# Patient Record
Sex: Female | Born: 1935 | Race: White | Hispanic: No | Marital: Married | Smoking: Never smoker
Health system: Southern US, Community
[De-identification: ages and names within clinical notes are randomized; demographics above are authoritative.]

## PROBLEM LIST (undated history)

## (undated) DIAGNOSIS — K219 Gastro-esophageal reflux disease without esophagitis: Secondary | ICD-10-CM

## (undated) DIAGNOSIS — Z7901 Long term (current) use of anticoagulants: Secondary | ICD-10-CM

## (undated) DIAGNOSIS — E876 Hypokalemia: Secondary | ICD-10-CM

## (undated) DIAGNOSIS — R7301 Impaired fasting glucose: Secondary | ICD-10-CM

## (undated) DIAGNOSIS — F419 Anxiety disorder, unspecified: Secondary | ICD-10-CM

## (undated) DIAGNOSIS — I4819 Other persistent atrial fibrillation: Secondary | ICD-10-CM

## (undated) DIAGNOSIS — E785 Hyperlipidemia, unspecified: Secondary | ICD-10-CM

## (undated) DIAGNOSIS — I10 Essential (primary) hypertension: Secondary | ICD-10-CM

## (undated) DIAGNOSIS — I4891 Unspecified atrial fibrillation: Secondary | ICD-10-CM

## (undated) HISTORY — PX: CATARACT EXTRACTION: SUR2

## (undated) HISTORY — PX: DILATION AND CURETTAGE OF UTERUS: SHX78

## (undated) HISTORY — DX: Hypokalemia: E87.6

## (undated) HISTORY — DX: Unspecified atrial fibrillation: I48.91

## (undated) HISTORY — DX: Other persistent atrial fibrillation: I48.19

## (undated) HISTORY — PX: BREAST EXCISIONAL BIOPSY: SUR124

## (undated) HISTORY — DX: Gastro-esophageal reflux disease without esophagitis: K21.9

## (undated) HISTORY — DX: Long term (current) use of anticoagulants: Z79.01

## (undated) HISTORY — DX: Impaired fasting glucose: R73.01

## (undated) HISTORY — DX: Hyperlipidemia, unspecified: E78.5

## (undated) HISTORY — DX: Anxiety disorder, unspecified: F41.9

## (undated) HISTORY — DX: Essential (primary) hypertension: I10

---

## 2000-01-11 ENCOUNTER — Encounter: Payer: Self-pay | Admitting: Family Medicine

## 2000-01-11 ENCOUNTER — Encounter: Admission: RE | Admit: 2000-01-11 | Discharge: 2000-01-11 | Payer: Self-pay | Admitting: Family Medicine

## 2001-01-13 ENCOUNTER — Encounter: Payer: Self-pay | Admitting: Family Medicine

## 2001-01-13 ENCOUNTER — Encounter: Admission: RE | Admit: 2001-01-13 | Discharge: 2001-01-13 | Payer: Self-pay | Admitting: Family Medicine

## 2002-01-29 ENCOUNTER — Encounter: Payer: Self-pay | Admitting: Family Medicine

## 2002-01-29 ENCOUNTER — Encounter: Admission: RE | Admit: 2002-01-29 | Discharge: 2002-01-29 | Payer: Self-pay | Admitting: Family Medicine

## 2003-03-08 ENCOUNTER — Encounter: Admission: RE | Admit: 2003-03-08 | Discharge: 2003-03-08 | Payer: Self-pay | Admitting: Family Medicine

## 2003-03-08 ENCOUNTER — Encounter: Payer: Self-pay | Admitting: Family Medicine

## 2004-03-11 ENCOUNTER — Encounter: Admission: RE | Admit: 2004-03-11 | Discharge: 2004-03-11 | Payer: Self-pay | Admitting: Family Medicine

## 2005-04-29 ENCOUNTER — Encounter: Admission: RE | Admit: 2005-04-29 | Discharge: 2005-04-29 | Payer: Self-pay | Admitting: Family Medicine

## 2006-05-24 ENCOUNTER — Encounter: Admission: RE | Admit: 2006-05-24 | Discharge: 2006-05-24 | Payer: Self-pay | Admitting: Family Medicine

## 2007-06-16 ENCOUNTER — Encounter: Admission: RE | Admit: 2007-06-16 | Discharge: 2007-06-16 | Payer: Self-pay | Admitting: Family Medicine

## 2008-06-17 ENCOUNTER — Encounter: Admission: RE | Admit: 2008-06-17 | Discharge: 2008-06-17 | Payer: Self-pay | Admitting: Family Medicine

## 2009-08-05 ENCOUNTER — Encounter: Admission: RE | Admit: 2009-08-05 | Discharge: 2009-08-05 | Payer: Self-pay | Admitting: Family Medicine

## 2010-09-08 ENCOUNTER — Encounter
Admission: RE | Admit: 2010-09-08 | Discharge: 2010-09-08 | Payer: Self-pay | Source: Home / Self Care | Attending: Family Medicine | Admitting: Family Medicine

## 2011-11-25 ENCOUNTER — Other Ambulatory Visit: Payer: Self-pay | Admitting: Family Medicine

## 2011-11-25 DIAGNOSIS — Z1231 Encounter for screening mammogram for malignant neoplasm of breast: Secondary | ICD-10-CM

## 2011-12-07 ENCOUNTER — Ambulatory Visit
Admission: RE | Admit: 2011-12-07 | Discharge: 2011-12-07 | Disposition: A | Discharge status: Home / Self Care | Payer: Medicare Other | Source: Ambulatory Visit | Attending: Family Medicine | Admitting: Family Medicine

## 2011-12-07 DIAGNOSIS — Z1231 Encounter for screening mammogram for malignant neoplasm of breast: Secondary | ICD-10-CM

## 2012-04-13 DIAGNOSIS — E7800 Pure hypercholesterolemia, unspecified: Secondary | ICD-10-CM | POA: Diagnosis not present

## 2012-04-13 DIAGNOSIS — Z79899 Other long term (current) drug therapy: Secondary | ICD-10-CM | POA: Diagnosis not present

## 2012-04-13 DIAGNOSIS — I10 Essential (primary) hypertension: Secondary | ICD-10-CM | POA: Diagnosis not present

## 2012-04-13 DIAGNOSIS — F411 Generalized anxiety disorder: Secondary | ICD-10-CM | POA: Diagnosis not present

## 2012-04-13 DIAGNOSIS — K219 Gastro-esophageal reflux disease without esophagitis: Secondary | ICD-10-CM | POA: Diagnosis not present

## 2012-04-13 DIAGNOSIS — R7301 Impaired fasting glucose: Secondary | ICD-10-CM | POA: Diagnosis not present

## 2012-07-07 DIAGNOSIS — H26499 Other secondary cataract, unspecified eye: Secondary | ICD-10-CM | POA: Diagnosis not present

## 2012-07-07 DIAGNOSIS — H2589 Other age-related cataract: Secondary | ICD-10-CM | POA: Diagnosis not present

## 2012-07-07 DIAGNOSIS — H43819 Vitreous degeneration, unspecified eye: Secondary | ICD-10-CM | POA: Diagnosis not present

## 2012-07-25 DIAGNOSIS — H35379 Puckering of macula, unspecified eye: Secondary | ICD-10-CM | POA: Diagnosis not present

## 2012-08-14 DIAGNOSIS — H35379 Puckering of macula, unspecified eye: Secondary | ICD-10-CM | POA: Diagnosis not present

## 2012-08-14 DIAGNOSIS — Z961 Presence of intraocular lens: Secondary | ICD-10-CM | POA: Diagnosis not present

## 2012-08-14 DIAGNOSIS — H35349 Macular cyst, hole, or pseudohole, unspecified eye: Secondary | ICD-10-CM | POA: Diagnosis not present

## 2012-08-31 DIAGNOSIS — Z23 Encounter for immunization: Secondary | ICD-10-CM | POA: Diagnosis not present

## 2012-10-10 DIAGNOSIS — H35349 Macular cyst, hole, or pseudohole, unspecified eye: Secondary | ICD-10-CM | POA: Diagnosis not present

## 2013-01-01 DIAGNOSIS — H2589 Other age-related cataract: Secondary | ICD-10-CM | POA: Diagnosis not present

## 2013-01-08 ENCOUNTER — Other Ambulatory Visit: Payer: Self-pay

## 2013-01-08 DIAGNOSIS — Z1231 Encounter for screening mammogram for malignant neoplasm of breast: Secondary | ICD-10-CM

## 2013-01-09 DIAGNOSIS — H35349 Macular cyst, hole, or pseudohole, unspecified eye: Secondary | ICD-10-CM | POA: Diagnosis not present

## 2013-01-09 DIAGNOSIS — H35379 Puckering of macula, unspecified eye: Secondary | ICD-10-CM | POA: Diagnosis not present

## 2013-01-30 ENCOUNTER — Ambulatory Visit
Admission: RE | Admit: 2013-01-30 | Discharge: 2013-01-30 | Disposition: A | Discharge status: Home / Self Care | Payer: Medicare Other | Source: Ambulatory Visit

## 2013-01-30 DIAGNOSIS — Z1231 Encounter for screening mammogram for malignant neoplasm of breast: Secondary | ICD-10-CM

## 2013-07-05 DIAGNOSIS — I10 Essential (primary) hypertension: Secondary | ICD-10-CM | POA: Diagnosis not present

## 2013-07-05 DIAGNOSIS — Z79899 Other long term (current) drug therapy: Secondary | ICD-10-CM | POA: Diagnosis not present

## 2013-07-05 DIAGNOSIS — Z23 Encounter for immunization: Secondary | ICD-10-CM | POA: Diagnosis not present

## 2013-07-05 DIAGNOSIS — E669 Obesity, unspecified: Secondary | ICD-10-CM | POA: Diagnosis not present

## 2013-07-05 DIAGNOSIS — Z0000 Encounter for general adult medical examination without abnormal findings: Secondary | ICD-10-CM | POA: Diagnosis not present

## 2013-07-05 DIAGNOSIS — K219 Gastro-esophageal reflux disease without esophagitis: Secondary | ICD-10-CM | POA: Diagnosis not present

## 2013-07-05 DIAGNOSIS — E7800 Pure hypercholesterolemia, unspecified: Secondary | ICD-10-CM | POA: Diagnosis not present

## 2013-07-16 DIAGNOSIS — J209 Acute bronchitis, unspecified: Secondary | ICD-10-CM | POA: Diagnosis not present

## 2013-07-16 DIAGNOSIS — I10 Essential (primary) hypertension: Secondary | ICD-10-CM | POA: Diagnosis not present

## 2013-07-16 DIAGNOSIS — R7309 Other abnormal glucose: Secondary | ICD-10-CM | POA: Diagnosis not present

## 2013-07-24 DIAGNOSIS — Z006 Encounter for examination for normal comparison and control in clinical research program: Secondary | ICD-10-CM | POA: Diagnosis not present

## 2013-07-24 DIAGNOSIS — I10 Essential (primary) hypertension: Secondary | ICD-10-CM | POA: Diagnosis not present

## 2013-07-24 DIAGNOSIS — E7800 Pure hypercholesterolemia, unspecified: Secondary | ICD-10-CM | POA: Diagnosis not present

## 2013-07-24 DIAGNOSIS — K219 Gastro-esophageal reflux disease without esophagitis: Secondary | ICD-10-CM | POA: Diagnosis not present

## 2013-08-13 DIAGNOSIS — T783XXA Angioneurotic edema, initial encounter: Secondary | ICD-10-CM | POA: Diagnosis not present

## 2014-02-15 ENCOUNTER — Other Ambulatory Visit: Payer: Self-pay

## 2014-02-15 DIAGNOSIS — Z1231 Encounter for screening mammogram for malignant neoplasm of breast: Secondary | ICD-10-CM

## 2014-03-08 ENCOUNTER — Ambulatory Visit
Admission: RE | Admit: 2014-03-08 | Discharge: 2014-03-08 | Disposition: A | Discharge status: Home / Self Care | Payer: Medicare Other | Source: Ambulatory Visit

## 2014-03-08 DIAGNOSIS — Z1231 Encounter for screening mammogram for malignant neoplasm of breast: Secondary | ICD-10-CM | POA: Diagnosis not present

## 2014-04-23 DIAGNOSIS — H2589 Other age-related cataract: Secondary | ICD-10-CM | POA: Diagnosis not present

## 2014-07-03 DIAGNOSIS — Z23 Encounter for immunization: Secondary | ICD-10-CM | POA: Diagnosis not present

## 2014-08-14 DIAGNOSIS — Z79899 Other long term (current) drug therapy: Secondary | ICD-10-CM | POA: Diagnosis not present

## 2014-08-14 DIAGNOSIS — E780 Pure hypercholesterolemia: Secondary | ICD-10-CM | POA: Diagnosis not present

## 2014-08-14 DIAGNOSIS — Z0000 Encounter for general adult medical examination without abnormal findings: Secondary | ICD-10-CM | POA: Diagnosis not present

## 2014-08-19 DIAGNOSIS — E559 Vitamin D deficiency, unspecified: Secondary | ICD-10-CM | POA: Diagnosis not present

## 2014-08-19 DIAGNOSIS — E780 Pure hypercholesterolemia: Secondary | ICD-10-CM | POA: Diagnosis not present

## 2014-08-19 DIAGNOSIS — I10 Essential (primary) hypertension: Secondary | ICD-10-CM | POA: Diagnosis not present

## 2014-08-19 DIAGNOSIS — Z23 Encounter for immunization: Secondary | ICD-10-CM | POA: Diagnosis not present

## 2014-08-19 DIAGNOSIS — R7301 Impaired fasting glucose: Secondary | ICD-10-CM | POA: Diagnosis not present

## 2014-08-19 DIAGNOSIS — Z0000 Encounter for general adult medical examination without abnormal findings: Secondary | ICD-10-CM | POA: Diagnosis not present

## 2015-02-13 DIAGNOSIS — H8113 Benign paroxysmal vertigo, bilateral: Secondary | ICD-10-CM | POA: Diagnosis not present

## 2015-05-15 ENCOUNTER — Other Ambulatory Visit: Payer: Self-pay

## 2015-05-15 DIAGNOSIS — Z1231 Encounter for screening mammogram for malignant neoplasm of breast: Secondary | ICD-10-CM

## 2015-05-27 DIAGNOSIS — J302 Other seasonal allergic rhinitis: Secondary | ICD-10-CM | POA: Diagnosis not present

## 2015-05-27 DIAGNOSIS — I10 Essential (primary) hypertension: Secondary | ICD-10-CM | POA: Diagnosis not present

## 2015-05-29 ENCOUNTER — Ambulatory Visit
Admission: RE | Admit: 2015-05-29 | Discharge: 2015-05-29 | Disposition: A | Discharge status: Home / Self Care | Payer: Medicare Other | Source: Ambulatory Visit

## 2015-05-29 DIAGNOSIS — Z1231 Encounter for screening mammogram for malignant neoplasm of breast: Secondary | ICD-10-CM

## 2015-06-30 DIAGNOSIS — E782 Mixed hyperlipidemia: Secondary | ICD-10-CM | POA: Diagnosis not present

## 2015-06-30 DIAGNOSIS — I10 Essential (primary) hypertension: Secondary | ICD-10-CM | POA: Diagnosis not present

## 2015-06-30 DIAGNOSIS — Z23 Encounter for immunization: Secondary | ICD-10-CM | POA: Diagnosis not present

## 2015-06-30 DIAGNOSIS — Z7901 Long term (current) use of anticoagulants: Secondary | ICD-10-CM | POA: Diagnosis not present

## 2015-06-30 DIAGNOSIS — I4891 Unspecified atrial fibrillation: Secondary | ICD-10-CM | POA: Diagnosis not present

## 2015-06-30 DIAGNOSIS — Z79899 Other long term (current) drug therapy: Secondary | ICD-10-CM | POA: Diagnosis not present

## 2015-07-15 DIAGNOSIS — I4819 Other persistent atrial fibrillation: Secondary | ICD-10-CM | POA: Insufficient documentation

## 2015-07-15 DIAGNOSIS — I482 Chronic atrial fibrillation, unspecified: Secondary | ICD-10-CM | POA: Insufficient documentation

## 2015-07-15 HISTORY — DX: Other persistent atrial fibrillation: I48.19

## 2015-07-15 HISTORY — DX: Chronic atrial fibrillation, unspecified: I48.20

## 2015-07-17 DIAGNOSIS — I4891 Unspecified atrial fibrillation: Secondary | ICD-10-CM | POA: Diagnosis not present

## 2015-07-17 DIAGNOSIS — I517 Cardiomegaly: Secondary | ICD-10-CM | POA: Diagnosis not present

## 2015-07-17 DIAGNOSIS — I10 Essential (primary) hypertension: Secondary | ICD-10-CM | POA: Diagnosis not present

## 2015-07-17 DIAGNOSIS — R9431 Abnormal electrocardiogram [ECG] [EKG]: Secondary | ICD-10-CM | POA: Diagnosis not present

## 2015-07-18 DIAGNOSIS — R7301 Impaired fasting glucose: Secondary | ICD-10-CM | POA: Diagnosis not present

## 2015-07-18 DIAGNOSIS — E7800 Pure hypercholesterolemia, unspecified: Secondary | ICD-10-CM | POA: Diagnosis not present

## 2015-07-18 DIAGNOSIS — Z1329 Encounter for screening for other suspected endocrine disorder: Secondary | ICD-10-CM | POA: Diagnosis not present

## 2015-07-22 DIAGNOSIS — I4891 Unspecified atrial fibrillation: Secondary | ICD-10-CM | POA: Diagnosis not present

## 2015-07-22 DIAGNOSIS — R7301 Impaired fasting glucose: Secondary | ICD-10-CM | POA: Diagnosis not present

## 2015-07-22 DIAGNOSIS — Z1389 Encounter for screening for other disorder: Secondary | ICD-10-CM | POA: Diagnosis not present

## 2015-07-22 DIAGNOSIS — I10 Essential (primary) hypertension: Secondary | ICD-10-CM | POA: Diagnosis not present

## 2015-07-28 DIAGNOSIS — I4891 Unspecified atrial fibrillation: Secondary | ICD-10-CM | POA: Diagnosis not present

## 2015-08-05 DIAGNOSIS — H25811 Combined forms of age-related cataract, right eye: Secondary | ICD-10-CM | POA: Diagnosis not present

## 2015-08-14 DIAGNOSIS — I4891 Unspecified atrial fibrillation: Secondary | ICD-10-CM | POA: Diagnosis not present

## 2015-08-18 DIAGNOSIS — I10 Essential (primary) hypertension: Secondary | ICD-10-CM | POA: Diagnosis not present

## 2015-08-18 DIAGNOSIS — I4891 Unspecified atrial fibrillation: Secondary | ICD-10-CM | POA: Diagnosis not present

## 2015-08-26 DIAGNOSIS — E7800 Pure hypercholesterolemia, unspecified: Secondary | ICD-10-CM | POA: Diagnosis not present

## 2015-08-26 DIAGNOSIS — Z0000 Encounter for general adult medical examination without abnormal findings: Secondary | ICD-10-CM | POA: Diagnosis not present

## 2015-08-26 DIAGNOSIS — I4891 Unspecified atrial fibrillation: Secondary | ICD-10-CM | POA: Diagnosis not present

## 2015-08-26 DIAGNOSIS — I10 Essential (primary) hypertension: Secondary | ICD-10-CM | POA: Diagnosis not present

## 2015-08-26 DIAGNOSIS — Z79899 Other long term (current) drug therapy: Secondary | ICD-10-CM | POA: Diagnosis not present

## 2015-09-03 DIAGNOSIS — Z7901 Long term (current) use of anticoagulants: Secondary | ICD-10-CM | POA: Diagnosis not present

## 2015-09-03 DIAGNOSIS — I10 Essential (primary) hypertension: Secondary | ICD-10-CM | POA: Diagnosis not present

## 2015-09-03 DIAGNOSIS — I481 Persistent atrial fibrillation: Secondary | ICD-10-CM | POA: Diagnosis not present

## 2015-12-23 DIAGNOSIS — E7800 Pure hypercholesterolemia, unspecified: Secondary | ICD-10-CM | POA: Diagnosis not present

## 2015-12-23 DIAGNOSIS — I10 Essential (primary) hypertension: Secondary | ICD-10-CM | POA: Diagnosis not present

## 2015-12-23 DIAGNOSIS — E876 Hypokalemia: Secondary | ICD-10-CM | POA: Diagnosis not present

## 2015-12-23 DIAGNOSIS — R739 Hyperglycemia, unspecified: Secondary | ICD-10-CM | POA: Diagnosis not present

## 2016-07-20 DIAGNOSIS — Z23 Encounter for immunization: Secondary | ICD-10-CM | POA: Diagnosis not present

## 2016-07-21 ENCOUNTER — Other Ambulatory Visit: Payer: Self-pay | Admitting: Family Medicine

## 2016-07-21 DIAGNOSIS — Z1231 Encounter for screening mammogram for malignant neoplasm of breast: Secondary | ICD-10-CM

## 2016-08-09 DIAGNOSIS — H25811 Combined forms of age-related cataract, right eye: Secondary | ICD-10-CM | POA: Diagnosis not present

## 2016-08-09 DIAGNOSIS — H35342 Macular cyst, hole, or pseudohole, left eye: Secondary | ICD-10-CM | POA: Diagnosis not present

## 2016-08-09 DIAGNOSIS — Z961 Presence of intraocular lens: Secondary | ICD-10-CM | POA: Diagnosis not present

## 2016-08-13 ENCOUNTER — Ambulatory Visit
Admission: RE | Admit: 2016-08-13 | Discharge: 2016-08-13 | Disposition: A | Discharge status: Home / Self Care | Payer: Medicare Other | Source: Ambulatory Visit | Attending: Family Medicine | Admitting: Family Medicine

## 2016-08-13 DIAGNOSIS — Z1231 Encounter for screening mammogram for malignant neoplasm of breast: Secondary | ICD-10-CM | POA: Diagnosis not present

## 2016-09-14 DIAGNOSIS — Z7901 Long term (current) use of anticoagulants: Secondary | ICD-10-CM | POA: Diagnosis not present

## 2016-09-14 DIAGNOSIS — I481 Persistent atrial fibrillation: Secondary | ICD-10-CM | POA: Diagnosis not present

## 2016-09-14 DIAGNOSIS — I10 Essential (primary) hypertension: Secondary | ICD-10-CM | POA: Diagnosis not present

## 2016-09-14 DIAGNOSIS — R600 Localized edema: Secondary | ICD-10-CM | POA: Diagnosis not present

## 2016-09-15 DIAGNOSIS — E785 Hyperlipidemia, unspecified: Secondary | ICD-10-CM | POA: Diagnosis not present

## 2016-09-15 DIAGNOSIS — Z0000 Encounter for general adult medical examination without abnormal findings: Secondary | ICD-10-CM | POA: Diagnosis not present

## 2016-09-15 DIAGNOSIS — K219 Gastro-esophageal reflux disease without esophagitis: Secondary | ICD-10-CM | POA: Diagnosis not present

## 2016-09-15 DIAGNOSIS — I10 Essential (primary) hypertension: Secondary | ICD-10-CM | POA: Diagnosis not present

## 2016-09-15 DIAGNOSIS — I4891 Unspecified atrial fibrillation: Secondary | ICD-10-CM | POA: Diagnosis not present

## 2016-09-15 DIAGNOSIS — R7301 Impaired fasting glucose: Secondary | ICD-10-CM | POA: Diagnosis not present

## 2016-09-15 DIAGNOSIS — Z79899 Other long term (current) drug therapy: Secondary | ICD-10-CM | POA: Diagnosis not present

## 2016-09-23 DIAGNOSIS — I481 Persistent atrial fibrillation: Secondary | ICD-10-CM | POA: Diagnosis not present

## 2017-07-20 ENCOUNTER — Other Ambulatory Visit: Payer: Self-pay | Admitting: Family Medicine

## 2017-07-20 DIAGNOSIS — Z139 Encounter for screening, unspecified: Secondary | ICD-10-CM

## 2017-08-15 ENCOUNTER — Ambulatory Visit
Admission: RE | Admit: 2017-08-15 | Discharge: 2017-08-15 | Disposition: A | Discharge status: Home / Self Care | Payer: Medicare Other | Source: Ambulatory Visit | Attending: Family Medicine | Admitting: Family Medicine

## 2017-08-15 DIAGNOSIS — Z139 Encounter for screening, unspecified: Secondary | ICD-10-CM

## 2017-08-15 DIAGNOSIS — Z1231 Encounter for screening mammogram for malignant neoplasm of breast: Secondary | ICD-10-CM | POA: Diagnosis not present

## 2017-09-28 ENCOUNTER — Encounter: Payer: Self-pay | Admitting: Cardiology

## 2017-09-28 DIAGNOSIS — I10 Essential (primary) hypertension: Secondary | ICD-10-CM | POA: Diagnosis not present

## 2017-09-28 DIAGNOSIS — Z79899 Other long term (current) drug therapy: Secondary | ICD-10-CM | POA: Diagnosis not present

## 2017-09-28 DIAGNOSIS — Z23 Encounter for immunization: Secondary | ICD-10-CM | POA: Diagnosis not present

## 2017-09-28 DIAGNOSIS — I4891 Unspecified atrial fibrillation: Secondary | ICD-10-CM | POA: Diagnosis not present

## 2017-09-28 DIAGNOSIS — R7301 Impaired fasting glucose: Secondary | ICD-10-CM | POA: Diagnosis not present

## 2017-09-28 DIAGNOSIS — E785 Hyperlipidemia, unspecified: Secondary | ICD-10-CM | POA: Diagnosis not present

## 2017-09-28 DIAGNOSIS — Z0000 Encounter for general adult medical examination without abnormal findings: Secondary | ICD-10-CM | POA: Diagnosis not present

## 2017-09-30 DIAGNOSIS — H25811 Combined forms of age-related cataract, right eye: Secondary | ICD-10-CM | POA: Diagnosis not present

## 2017-09-30 DIAGNOSIS — Z961 Presence of intraocular lens: Secondary | ICD-10-CM | POA: Diagnosis not present

## 2017-09-30 DIAGNOSIS — H35342 Macular cyst, hole, or pseudohole, left eye: Secondary | ICD-10-CM | POA: Diagnosis not present

## 2017-10-24 DIAGNOSIS — F419 Anxiety disorder, unspecified: Secondary | ICD-10-CM

## 2017-10-24 DIAGNOSIS — I10 Essential (primary) hypertension: Secondary | ICD-10-CM | POA: Insufficient documentation

## 2017-10-24 DIAGNOSIS — K219 Gastro-esophageal reflux disease without esophagitis: Secondary | ICD-10-CM

## 2017-10-24 DIAGNOSIS — I119 Hypertensive heart disease without heart failure: Secondary | ICD-10-CM

## 2017-10-24 DIAGNOSIS — E785 Hyperlipidemia, unspecified: Secondary | ICD-10-CM | POA: Insufficient documentation

## 2017-10-24 HISTORY — DX: Hypertensive heart disease without heart failure: I11.9

## 2017-10-24 HISTORY — DX: Hyperlipidemia, unspecified: E78.5

## 2017-10-24 HISTORY — DX: Anxiety disorder, unspecified: F41.9

## 2017-10-24 HISTORY — DX: Gastro-esophageal reflux disease without esophagitis: K21.9

## 2017-10-24 HISTORY — DX: Essential (primary) hypertension: I10

## 2017-10-27 ENCOUNTER — Ambulatory Visit (INDEPENDENT_AMBULATORY_CARE_PROVIDER_SITE_OTHER): Payer: Medicare Other | Admitting: Cardiology

## 2017-10-27 ENCOUNTER — Encounter: Payer: Self-pay | Admitting: Cardiology

## 2017-10-27 VITALS — BP 144/90 | HR 74 | Ht 64.0 in | Wt 227.0 lb

## 2017-10-27 DIAGNOSIS — Z7901 Long term (current) use of anticoagulants: Secondary | ICD-10-CM | POA: Diagnosis not present

## 2017-10-27 DIAGNOSIS — I482 Chronic atrial fibrillation, unspecified: Secondary | ICD-10-CM

## 2017-10-27 DIAGNOSIS — I119 Hypertensive heart disease without heart failure: Secondary | ICD-10-CM | POA: Diagnosis not present

## 2017-10-27 HISTORY — DX: Long term (current) use of anticoagulants: Z79.01

## 2017-10-27 NOTE — Patient Instructions (Addendum)
Medication Instructions:  Your physician recommends that you continue on your current medications as directed. Please refer to the Current Medication list given to you today.  Labwork: None  Testing/Procedures: You had an EKG today.  Follow-Up: Your physician wants you to follow-up in: 1 year. You will receive a reminder letter in the mail two months in advance. If you don't receive a letter, please call our office to schedule the follow-up appointment.  Any Other Special Instructions Will Be Listed Below (If Applicable).     If you need a refill on your cardiac medications before your next appointment, please call your pharmacy.    Atrial Fibrillation Atrial fibrillation is a type of heartbeat that is irregular or fast (rapid). If you have this condition, your heart keeps quivering in a weird (chaotic) way. This condition can make it so your heart cannot pump blood normally. Having this condition gives a person more risk for stroke, heart failure, and other heart problems. There are different types of atrial fibrillation. Talk with your doctor to learn about the type that you have. Follow these instructions at home:  Take over-the-counter and prescription medicines only as told by your doctor.  If your doctor prescribed a blood-thinning medicine, take it exactly as told. Taking too much of it can cause bleeding. If you do not take enough of it, you will not have the protection that you need against stroke and other problems.  Do not use any tobacco products. These include cigarettes, chewing tobacco, and e-cigarettes. If you need help quitting, ask your doctor.  If you have apnea (obstructive sleep apnea), manage it as told by your doctor.  Do not drink alcohol.  Do not drink beverages that have caffeine. These include coffee, soda, and tea.  Maintain a healthy weight. Do not use diet pills unless your doctor says they are safe for you. Diet pills may make heart problems  worse.  Follow diet instructions as told by your doctor.  Exercise regularly as told by your doctor.  Keep all follow-up visits as told by your doctor. This is important. Contact a doctor if:  You notice a change in the speed, rhythm, or strength of your heartbeat.  You are taking a blood-thinning medicine and you notice more bruising.  You get tired more easily when you move or exercise. Get help right away if:  You have pain in your chest or your belly (abdomen).  You have sweating or weakness.  You feel sick to your stomach (nauseous).  You notice blood in your throw up (vomit), poop (stool), or pee (urine).  You are short of breath.  You suddenly have swollen feet and ankles.  You feel dizzy.  Your suddenly get weak or numb in your face, arms, or legs, especially if it happens on one side of your body.  You have trouble talking, trouble understanding, or both.  Your face or your eyelid droops on one side. These symptoms may be an emergency. Do not wait to see if the symptoms will go away. Get medical help right away. Call your local emergency services (911 in the [place] Do not drive yourself to the hospital. This information is not intended to replace advice given to you by your health care provider. Make sure you discuss any questions you have with your health care provider. Document Released: 06/22/2008 Document Revised: 02/19/2016 Document Reviewed: 01/08/2015 Elsevier Interactive Patient Education  [employer].

## 2017-10-27 NOTE — Progress Notes (Signed)
Cardiology Office Note:    Date:  10/27/2017   ID:  Stephanie Wiggins, DOB 08-Feb-1936, MRN [id number]  PCP:  Street, Sharon Mt, MD  Cardiologist:  Shirlee More, MD    Referring MD: [address], Sharon Mt, *    ASSESSMENT:    1. Chronic anticoagulation   2. Chronic atrial fibrillation (HCC)   3. Hypertensive heart disease without heart failure    PLAN:    In order of problems listed above:  1. Stable she will continue her current anticoagulant 2. Stable rate controlled continue beta-blocker 3. Continue current antihypertensives.  I did not increase she has had a very difficult stressful day trying to find the office repeat blood pressure by me was 162/90.   Next appointment: One year   Medication Adjustments/Labs and Tests Ordered: Current medicines are reviewed at length with the patient today.  Concerns regarding medicines are outlined above.  No orders of the defined types were placed in this encounter.  No orders of the defined types were placed in this encounter.   Chief Complaint  Patient presents with  . Follow-up  . Edema  . Shortness of Breath  . Atrial Fibrillation  . Hypertension    History of Present Illness:    Stephanie Wiggins is a 82 y.o. female with a hx of chronic Atrial Fibrillation taking apixaban, HTN  last seen 1 year ago.. Compliance with diet, lifestyle and medications: Yes She relates she has done well has not had palpitation exercise intolerance TIA bleeding complication of her anticoagulant shortness of breath or syncope.  She is flustered could not find my office low emotionally distressed and assures me that her blood pressure is normal in other situations.  Recent labs requested from her PCP Past Medical History:  Diagnosis Date  . Anxiety 10/24/2017  . Esophageal reflux 10/24/2017  . Hyperlipidemia 10/24/2017  . Hypertension 10/24/2017  . Persistent atrial fibrillation ([place]) 07/15/2015   Overview:  CHADS2 vasc score= 4 Echo with  mild LVH, normal EF% and mild MR, LA normal Holter with rate controlled AF    Past Surgical History:  Procedure Laterality Date  . BREAST EXCISIONAL BIOPSY Right 40 yrs   benign  . CATARACT EXTRACTION    . DILATION AND CURETTAGE OF UTERUS      Current Medications: Current Meds  Medication Sig  . carvedilol (COREG) 25 MG tablet Take 12.5 mg by mouth 2 (two) times daily.  . cetirizine (ZYRTEC) 10 MG tablet Take 10 mg by mouth daily as needed for allergies.  . chlorthalidone (HYGROTON) 25 MG tablet Take 25 mg by mouth daily.  . clorazepate (TRANXENE) 3.75 MG tablet Take 3.75 mg by mouth daily as needed for anxiety.  Marland Kitchen ELIQUIS 5 MG TABS tablet Take 5 mg by mouth 2 (two) times daily.  . fluticasone (FLONASE) 50 MCG/ACT nasal spray 1 spray by Each Nare route daily as needed.  Marland Kitchen losartan (COZAAR) 100 MG tablet Take 100 mg by mouth daily.  . Multiple Vitamins-Minerals (PRESERVISION AREDS) CAPS Take 1 capsule by mouth daily.  Marland Kitchen omeprazole (PRILOSEC OTC) 20 MG tablet Take 10 mg by mouth daily.  . simvastatin (ZOCOR) 40 MG tablet Take 40 mg by mouth daily.     Allergies:   Penicillins   Social History   Socioeconomic History  . Marital status: Married    Spouse name: None  . Number of children: None  . Years of education: None  . Highest education level: None  Social Needs  .  Financial resource strain: None  . Food insecurity - worry: None  . Food insecurity - inability: None  . Transportation needs - medical: None  . Transportation needs - non-medical: None  Occupational History  . None  Tobacco Use  . Smoking status: Never Smoker  . Smokeless tobacco: Never Used  Substance and Sexual Activity  . Alcohol use: No    Frequency: Never  . Drug use: No  . Sexual activity: None  Other Topics Concern  . None  Social History Narrative  . None     Family History: The patient's family history includes Diabetes in her brother; Heart attack in her father; Hypertension in her  brother and sister; Stroke in her mother. ROS:   Please see the history of present illness.    All other systems reviewed and are negative.  EKGs/Labs/Other Studies Reviewed:    The following studies were reviewed today:  EKG:  EKG ordered today.  The ekg ordered today demonstrates atrial fibrillation nonspecific T waves  Recent Labs: Requested from Dr. Nicki Reaper No results found for requested labs within last 8760 hours.  Recent Lipid Panel No results found for: CHOL, TRIG, HDL, CHOLHDL, VLDL, LDLCALC, LDLDIRECT  Physical Exam:    VS:  BP (!) 182/106 (BP Location: Left Arm, Patient Position: Sitting, Cuff Size: Large)   Pulse 74   Ht 5\' 4"  (1.626 m)   Wt 227 lb (103 kg)   SpO2 98%   BMI 38.96 kg/m     Wt Readings from Last 3 Encounters:  10/27/17 227 lb (103 kg)     GEN:  Well nourished, well developed in no acute distress HEENT: Normal NECK: No JVD; No carotid bruits LYMPHATICS: No lymphadenopathy CARDIAC: Irr Irr variable s1 RESPIRATORY:  Clear to auscultation without rales, wheezing or rhonchi  ABDOMEN: Soft, non-tender, non-distended MUSCULOSKELETAL:  No edema; No deformity  SKIN: Warm and dry NEUROLOGIC:  Alert and oriented x 3 PSYCHIATRIC:  Normal affect    Signed, Shirlee More, MD  10/27/2017 2:43 PM    [facility]

## 2017-11-03 DIAGNOSIS — I4891 Unspecified atrial fibrillation: Secondary | ICD-10-CM | POA: Diagnosis not present

## 2017-11-03 DIAGNOSIS — I10 Essential (primary) hypertension: Secondary | ICD-10-CM | POA: Diagnosis not present

## 2017-11-03 DIAGNOSIS — K429 Umbilical hernia without obstruction or gangrene: Secondary | ICD-10-CM | POA: Diagnosis not present

## 2018-01-09 ENCOUNTER — Other Ambulatory Visit: Payer: Self-pay | Admitting: Cardiology

## 2018-04-24 DIAGNOSIS — I4891 Unspecified atrial fibrillation: Secondary | ICD-10-CM | POA: Diagnosis not present

## 2018-04-24 DIAGNOSIS — F419 Anxiety disorder, unspecified: Secondary | ICD-10-CM | POA: Diagnosis not present

## 2018-04-24 DIAGNOSIS — Z79899 Other long term (current) drug therapy: Secondary | ICD-10-CM | POA: Diagnosis not present

## 2018-04-24 DIAGNOSIS — R55 Syncope and collapse: Secondary | ICD-10-CM | POA: Diagnosis not present

## 2018-04-24 DIAGNOSIS — R7301 Impaired fasting glucose: Secondary | ICD-10-CM | POA: Diagnosis not present

## 2018-04-24 DIAGNOSIS — E785 Hyperlipidemia, unspecified: Secondary | ICD-10-CM | POA: Diagnosis not present

## 2018-07-10 DIAGNOSIS — D649 Anemia, unspecified: Secondary | ICD-10-CM | POA: Diagnosis not present

## 2018-07-10 DIAGNOSIS — Z6841 Body Mass Index (BMI) 40.0 and over, adult: Secondary | ICD-10-CM | POA: Diagnosis not present

## 2018-07-10 DIAGNOSIS — K429 Umbilical hernia without obstruction or gangrene: Secondary | ICD-10-CM | POA: Diagnosis not present

## 2018-07-10 DIAGNOSIS — Z23 Encounter for immunization: Secondary | ICD-10-CM | POA: Diagnosis not present

## 2018-08-29 ENCOUNTER — Other Ambulatory Visit: Payer: Self-pay | Admitting: Family Medicine

## 2018-08-29 DIAGNOSIS — Z1231 Encounter for screening mammogram for malignant neoplasm of breast: Secondary | ICD-10-CM

## 2018-09-28 DIAGNOSIS — J0140 Acute pansinusitis, unspecified: Secondary | ICD-10-CM | POA: Diagnosis not present

## 2018-09-28 DIAGNOSIS — J40 Bronchitis, not specified as acute or chronic: Secondary | ICD-10-CM | POA: Diagnosis not present

## 2018-10-11 ENCOUNTER — Ambulatory Visit
Admission: RE | Admit: 2018-10-11 | Discharge: 2018-10-11 | Disposition: A | Discharge status: Home / Self Care | Payer: Medicare Other | Source: Ambulatory Visit | Attending: Family Medicine | Admitting: Family Medicine

## 2018-10-11 DIAGNOSIS — Z1231 Encounter for screening mammogram for malignant neoplasm of breast: Secondary | ICD-10-CM

## 2018-12-19 DIAGNOSIS — Z79899 Other long term (current) drug therapy: Secondary | ICD-10-CM | POA: Diagnosis not present

## 2018-12-19 DIAGNOSIS — K429 Umbilical hernia without obstruction or gangrene: Secondary | ICD-10-CM | POA: Diagnosis not present

## 2018-12-19 DIAGNOSIS — I4891 Unspecified atrial fibrillation: Secondary | ICD-10-CM | POA: Diagnosis not present

## 2018-12-19 DIAGNOSIS — Z0000 Encounter for general adult medical examination without abnormal findings: Secondary | ICD-10-CM | POA: Diagnosis not present

## 2018-12-19 DIAGNOSIS — R7301 Impaired fasting glucose: Secondary | ICD-10-CM | POA: Diagnosis not present

## 2018-12-19 DIAGNOSIS — K219 Gastro-esophageal reflux disease without esophagitis: Secondary | ICD-10-CM | POA: Diagnosis not present

## 2018-12-19 DIAGNOSIS — E785 Hyperlipidemia, unspecified: Secondary | ICD-10-CM | POA: Diagnosis not present

## 2018-12-19 DIAGNOSIS — I10 Essential (primary) hypertension: Secondary | ICD-10-CM | POA: Diagnosis not present

## 2018-12-21 ENCOUNTER — Encounter: Payer: Self-pay | Admitting: Cardiology

## 2019-03-13 ENCOUNTER — Other Ambulatory Visit: Payer: Self-pay | Admitting: Cardiology

## 2019-04-24 NOTE — Progress Notes (Signed)
Cardiology Office Note:    Date:  04/25/2019   ID:  Stephanie Wiggins, DOB 1936/07/09, MRN [id number]  PCP:  Street, Sharon Mt, MD  Cardiologist:  Shirlee More, MD    Referring MD: [address], Sharon Mt, *    ASSESSMENT:    1. Chronic atrial fibrillation   2. Chronic anticoagulation   3. Hypertensive heart disease without heart failure    PLAN:    In order of problems listed above:  1. Stable rate controlled continue beta-blocker anticoagulant 2. Continue anticoagulant 3. With peripheral edema for better BP control switch from thiazide to loop diuretic labs to be followed in her PCP office   Next appointment: 1 year   Medication Adjustments/Labs and Tests Ordered: Current medicines are reviewed at length with the patient today.  Concerns regarding medicines are outlined above.  No orders of the defined types were placed in this encounter.  No orders of the defined types were placed in this encounter.   No chief complaint on file.   History of Present Illness:    Stephanie Wiggins is a 83 y.o. female with a hx chronic Atrial Fibrillation taking apixaban, HTN  last seen 10/27/2017. Compliance with diet, lifestyle and medications: Yes  Unfortunately cannot bring up the labs from PCP office today due to failure of computer program.  She has noticed peripheral edema add salt to her diet her blood pressure repeat by me is in the range of 140 150/80-90 difficult to do with atrial fibrillation I will switch her from chlorthalidone to furosemide that should help with edema and BP control.  She has had no palpitations shortness of breath orthopnea syncope TIA or bleeding from her anticoagulant and tells me lab work in February was reassuring at her PCP office Past Medical History:  Diagnosis Date  . Anxiety 10/24/2017  . Esophageal reflux 10/24/2017  . Hyperlipidemia 10/24/2017  . Hypertension 10/24/2017  . Persistent atrial fibrillation ([place]) 07/15/2015   Overview:  CHADS2 vasc score= 4 Echo with mild LVH, normal EF% and mild MR, LA normal Holter with rate controlled AF    Past Surgical History:  Procedure Laterality Date  . BREAST EXCISIONAL BIOPSY Right 40 yrs   benign  . CATARACT EXTRACTION    . DILATION AND CURETTAGE OF UTERUS      Current Medications: No outpatient medications have been marked as taking for the 04/25/19 encounter (Appointment) with Richardo Priest, MD.     Allergies:   Penicillins   Social History   Socioeconomic History  . Marital status: Married    Spouse name: Not on file  . Number of children: Not on file  . Years of education: Not on file  . Highest education level: Not on file  Occupational History  . Not on file  Social Needs  . Financial resource strain: Not on file  . Food insecurity    Worry: Not on file    Inability: Not on file  . Transportation needs    Medical: Not on file    Non-medical: Not on file  Tobacco Use  . Smoking status: Never Smoker  . Smokeless tobacco: Never Used  Substance and Sexual Activity  . Alcohol use: No    Frequency: Never  . Drug use: No  . Sexual activity: Not on file  Lifestyle  . Physical activity    Days per week: Not on file    Minutes per session: Not on file  . Stress: Not on file  Relationships  .  Social Herbalist on phone: Not on file    Gets together: Not on file    Attends religious service: Not on file    Active member of club or organization: Not on file    Attends meetings of clubs or organizations: Not on file    Relationship status: Not on file  Other Topics Concern  . Not on file  Social History Narrative  . Not on file     Family History: The patient's family history includes Diabetes in her brother; Heart attack in her father; Hypertension in her brother and sister; Stroke in her mother. ROS:   Please see the history of present illness.    All other systems reviewed and are negative.  EKGs/Labs/Other Studies  Reviewed:    The following studies were reviewed today:  EKG:  EKG ordered today and personally reviewed.  The ekg ordered today demonstrates shows atrial fibrillation controlled rate and marked repolarization changes which are similar to her previous EKG in 2019  Recent Labs: Unable to be accessed through the  protocol  Physical Exam:    VS:  There were no vitals taken for this visit.    Wt Readings from Last 3 Encounters:  10/27/17 227 lb (103 kg)     GEN:  Well nourished, well developed in no acute distress HEENT: Normal NECK: No JVD; No carotid bruits LYMPHATICS: No lymphadenopathy CARDIAC: Irregular irregular variable first heart sound RRR, no murmurs, rubs, gallops RESPIRATORY:  Clear to auscultation without rales, wheezing or rhonchi  ABDOMEN: Soft, non-tender, non-distended MUSCULOSKELETAL:   1-2+ bilateral ankle to knee edema; No deformity  SKIN: Warm and dry NEUROLOGIC:  Alert and oriented x 3 PSYCHIATRIC:  Normal affect    Signed, Shirlee More, MD  04/25/2019 1:21 PM    [facility]

## 2019-04-25 ENCOUNTER — Other Ambulatory Visit: Payer: Self-pay

## 2019-04-25 ENCOUNTER — Ambulatory Visit (INDEPENDENT_AMBULATORY_CARE_PROVIDER_SITE_OTHER): Payer: Medicare Other | Admitting: Cardiology

## 2019-04-25 ENCOUNTER — Encounter: Payer: Self-pay | Admitting: Cardiology

## 2019-04-25 VITALS — BP 182/118 | HR 81 | Temp 99.1°F | Ht 64.0 in | Wt 220.6 lb

## 2019-04-25 DIAGNOSIS — I482 Chronic atrial fibrillation, unspecified: Secondary | ICD-10-CM

## 2019-04-25 DIAGNOSIS — I119 Hypertensive heart disease without heart failure: Secondary | ICD-10-CM | POA: Diagnosis not present

## 2019-04-25 DIAGNOSIS — Z7901 Long term (current) use of anticoagulants: Secondary | ICD-10-CM | POA: Diagnosis not present

## 2019-04-25 MED ORDER — FUROSEMIDE 20 MG PO TABS: 20.0000 mg | ORAL_TABLET | Freq: Every day | ORAL | 3 refills | Status: DC

## 2019-04-25 NOTE — Patient Instructions (Signed)
Medication Instructions:  Your physician has recommended you make the following change in your medication:   STOP chlorthalidone ([place])   START furosemide (lasix) 20 mg: Take 1 tablet daily   If you need a refill on your cardiac medications before your next appointment, please call your pharmacy.   Lab work: None  If you have labs (blood work) drawn today and your tests are completely normal, you will receive your results only by: Marland Kitchen MyChart Message (if you have MyChart) OR . A paper copy in the mail If you have any lab test that is abnormal or we need to change your treatment, we will call you to review the results.  Testing/Procedures: You had an EKG today.   Follow-Up: At [facility], you and your health needs are our priority.  As part of our continuing mission to provide you with exceptional heart care, we have created designated Provider Care Teams.  These Care Teams include your primary Cardiologist (physician) and Advanced Practice Providers (APPs -  Physician Assistants and Nurse Practitioners) who all work together to provide you with the care you need, when you need it. You will need a follow up appointment in 1 years.  Please call our office 2 months in advance to schedule this appointment.      Furosemide tablets What is this medicine? FUROSEMIDE (fyoor [place] se mide) is a diuretic. It helps you make more urine and to lose salt and excess water from your body. This medicine is used to treat high blood pressure, and edema or swelling from heart, kidney, or liver disease. This medicine may be used for other purposes; ask your health care provider or pharmacist if you have questions. COMMON BRAND NAME(S): Active-Medicated Specimen Kit, Delone, Diuscreen, Lasix, RX Specimen Collection Kit, Specimen Collection Kit, URINX Medicated Specimen Collection What should I tell my health care provider before I take this medicine? They need to know if you have any of these  conditions:  abnormal blood electrolytes  diarrhea or vomiting  gout  heart disease  kidney disease, small amounts of urine, or difficulty passing urine  liver disease  thyroid disease  an unusual or allergic reaction to furosemide, sulfa drugs, other medicines, foods, dyes, or preservatives  pregnant or trying to get pregnant  breast-feeding How should I use this medicine? Take this medicine by mouth with a glass of water. Follow the directions on the prescription label. You may take this medicine with or without food. If it upsets your stomach, take it with food or milk. Do not take your medicine more often than directed. Remember that you will need to pass more urine after taking this medicine. Do not take your medicine at a time of day that will cause you problems. Do not take at bedtime. Talk to your pediatrician regarding the use of this medicine in children. While this drug may be prescribed for selected conditions, precautions do apply. Overdosage: If you think you have taken too much of this medicine contact a poison control center or emergency room at once. NOTE: This medicine is only for you. Do not share this medicine with others. What if I miss a dose? If you miss a dose, take it as soon as you can. If it is almost time for your next dose, take only that dose. Do not take double or extra doses. What may interact with this medicine?  aspirin and aspirin-like medicines  certain antibiotics  chloral hydrate  cisplatin  cyclosporine  digoxin  diuretics  laxatives  lithium  medicines for blood pressure  medicines that relax muscles for surgery  methotrexate  NSAIDs, medicines for pain and inflammation like ibuprofen, naproxen, or indomethacin  phenytoin  steroid medicines like prednisone or cortisone  sucralfate  thyroid hormones This list may not describe all possible interactions. Give your health care provider a list of all the medicines,  herbs, non-prescription drugs, or dietary supplements you use. Also tell them if you smoke, drink alcohol, or use illegal drugs. Some items may interact with your medicine. What should I watch for while using this medicine? Visit your doctor or health care provider for regular checks on your progress. Check your blood pressure regularly. Ask your doctor or health care provider what your blood pressure should be, and when you should contact him or her. If you are a diabetic, check your blood sugar as directed. This medicine may cause serious skin reactions. They can happen weeks to months after starting the medicine. Contact your health care provider right away if you notice fevers or flu-like symptoms with a rash. The rash may be red or purple and then turn into blisters or peeling of the skin. Or, you might notice a red rash with swelling of the face, lips or lymph nodes in your neck or under your arms. You may need to be on a special diet while taking this medicine. Check with your doctor. Also, ask how many glasses of fluid you need to drink a day. You must not get dehydrated. You may get drowsy or dizzy. Do not drive, use machinery, or do anything that needs mental alertness until you know how this drug affects you. Do not stand or sit up quickly, especially if you are an older patient. This reduces the risk of dizzy or fainting spells. Alcohol can make you more drowsy and dizzy. Avoid alcoholic drinks. This medicine can make you more sensitive to the sun. Keep out of the sun. If you cannot avoid being in the sun, wear protective clothing and use sunscreen. Do not use sun lamps or tanning beds/booths. What side effects may I notice from receiving this medicine? Side effects that you should report to your doctor or health care professional as soon as possible:  blood in urine or stools  dry mouth  fever or chills  hearing loss or ringing in the ears  irregular heartbeat  muscle pain or  weakness, cramps  rash, fever, and swollen lymph nodes  redness, blistering, peeling or loosening of the skin, including inside the mouth  skin rash  stomach upset, pain, or nausea  tingling or numbness in the hands or feet  unusually weak or tired  vomiting or diarrhea  yellowing of the eyes or skin Side effects that usually do not require medical attention (report to your doctor or health care professional if they continue or are bothersome):  headache  loss of appetite  unusual bleeding or bruising This list may not describe all possible side effects. Call your doctor for medical advice about side effects. You may report side effects to FDA at 1-800-FDA-1088. Where should I keep my medicine? Keep out of the reach of children. Store at room temperature between 15 and 30 degrees C (59 and 86 degrees F). Protect from light. Throw away any unused medicine after the expiration date. NOTE: This sheet is a summary. It may not cover all possible information. If you have questions about this medicine, talk to your doctor, pharmacist, or health care provider.  2020 Elsevier/Gold  Standard (2018-12-15 14:04:13)

## 2019-05-04 ENCOUNTER — Telehealth: Payer: Self-pay | Admitting: Cardiology

## 2019-05-04 MED ORDER — FUROSEMIDE 20 MG PO TABS: 20.0000 mg | ORAL_TABLET | Freq: Every day | ORAL | 3 refills | Status: DC

## 2019-05-04 NOTE — Addendum Note (Signed)
Addended by: Austin Miles on: 05/04/2019 04:03 PM   Modules accepted: Orders

## 2019-05-04 NOTE — Telephone Encounter (Signed)
Call lasix to [facility]

## 2019-05-04 NOTE — Telephone Encounter (Signed)
Refill for lasix sent to [facility] as requested.

## 2019-05-15 DIAGNOSIS — Z961 Presence of intraocular lens: Secondary | ICD-10-CM | POA: Diagnosis not present

## 2019-05-15 DIAGNOSIS — H25811 Combined forms of age-related cataract, right eye: Secondary | ICD-10-CM | POA: Diagnosis not present

## 2019-05-15 DIAGNOSIS — H35342 Macular cyst, hole, or pseudohole, left eye: Secondary | ICD-10-CM | POA: Diagnosis not present

## 2019-06-13 ENCOUNTER — Other Ambulatory Visit: Payer: Self-pay

## 2019-06-13 ENCOUNTER — Telehealth: Payer: Self-pay | Admitting: Cardiology

## 2019-06-13 MED ORDER — CARVEDILOL 25 MG PO TABS: ORAL_TABLET | ORAL | 0 refills | Status: DC

## 2019-06-13 NOTE — Telephone Encounter (Signed)
Rx for carvedilol sent to [facility] as requested.

## 2019-06-13 NOTE — Telephone Encounter (Signed)
Call week supply carvadolol to [facility]

## 2019-06-13 NOTE — Telephone Encounter (Signed)
Rx for carvedilol sent to [facility] as requested.

## 2019-06-25 DIAGNOSIS — Z23 Encounter for immunization: Secondary | ICD-10-CM | POA: Diagnosis not present

## 2019-09-04 ENCOUNTER — Other Ambulatory Visit: Payer: Self-pay | Admitting: Cardiology

## 2019-10-20 IMAGING — MG DIGITAL SCREENING BILATERAL MAMMOGRAM WITH TOMO AND CAD
8 series · 8 of 24 positions shown · non-contrast
Comparison: Previous exam(s).

CLINICAL DATA: Screening.

EXAM:
DIGITAL SCREENING BILATERAL MAMMOGRAM WITH TOMO AND CAD

[L MLO synth-2D]
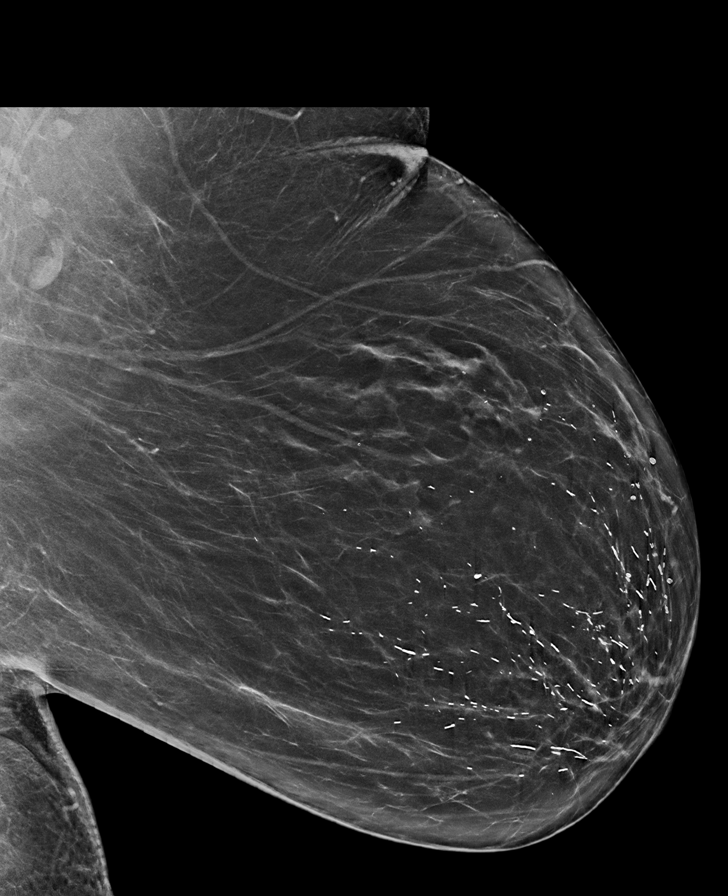

[R CC synth-2D]
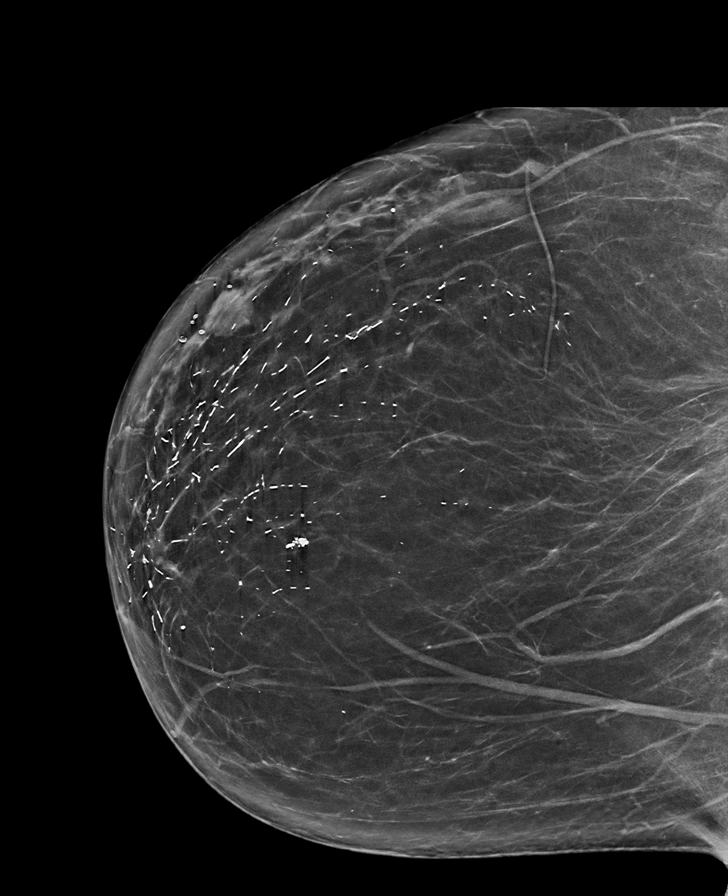

[L CC synth-2D]
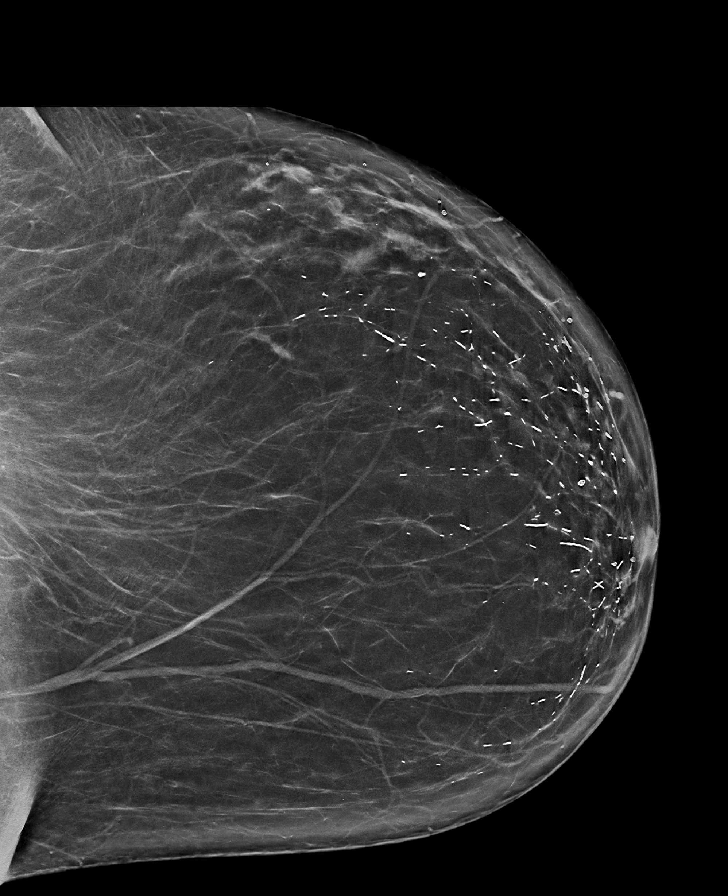

[R MLO synth-2D]
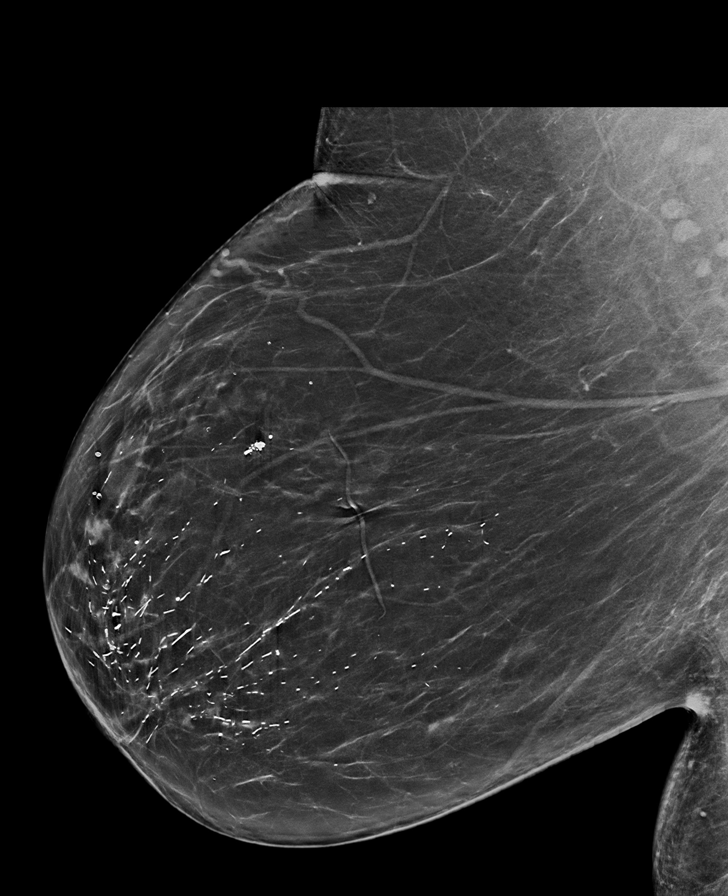

[L MLO tomo · tomo slice 41/82.0]
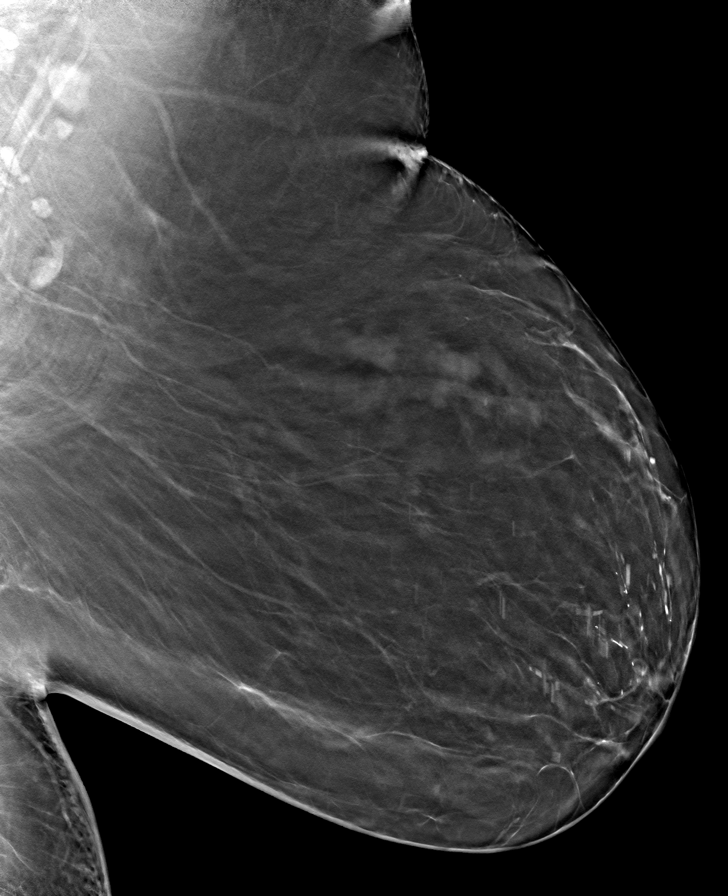

[R CC tomo · tomo slice 34/67.0]
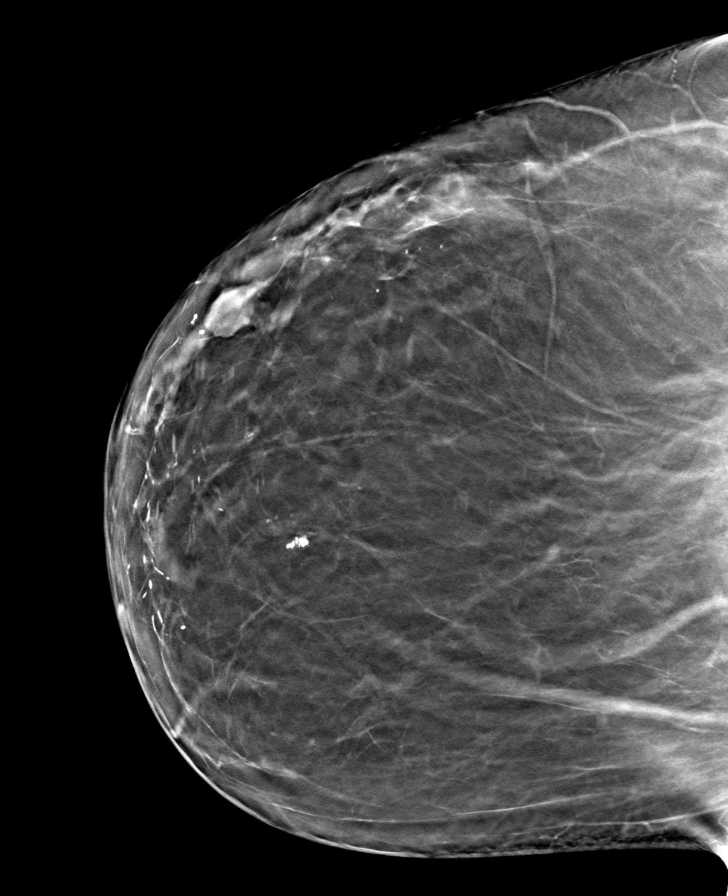

[R MLO tomo · tomo slice 41/81.0]
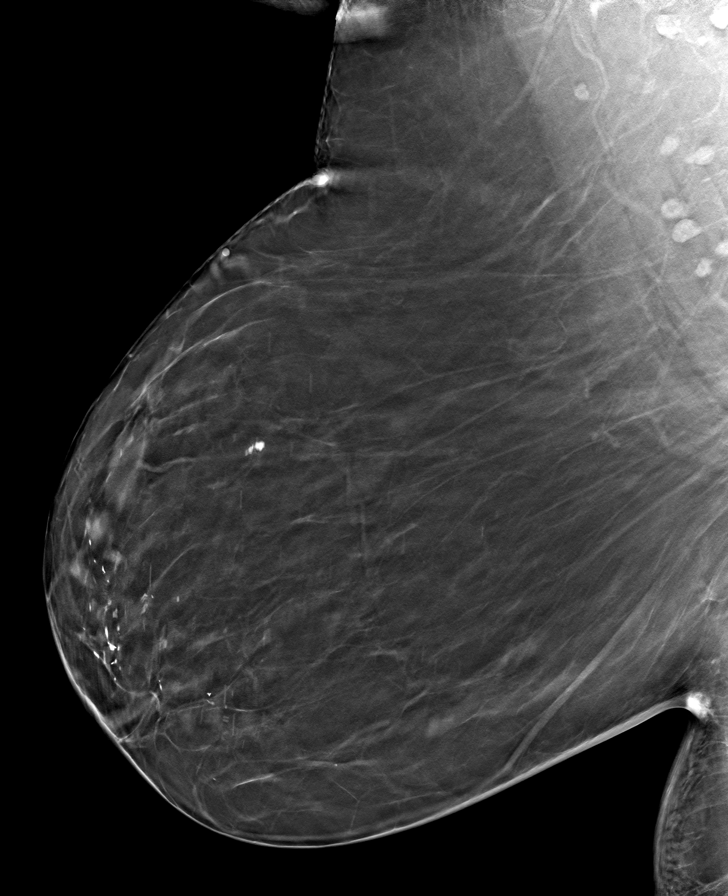

[L CC tomo · tomo slice 37/72.0]
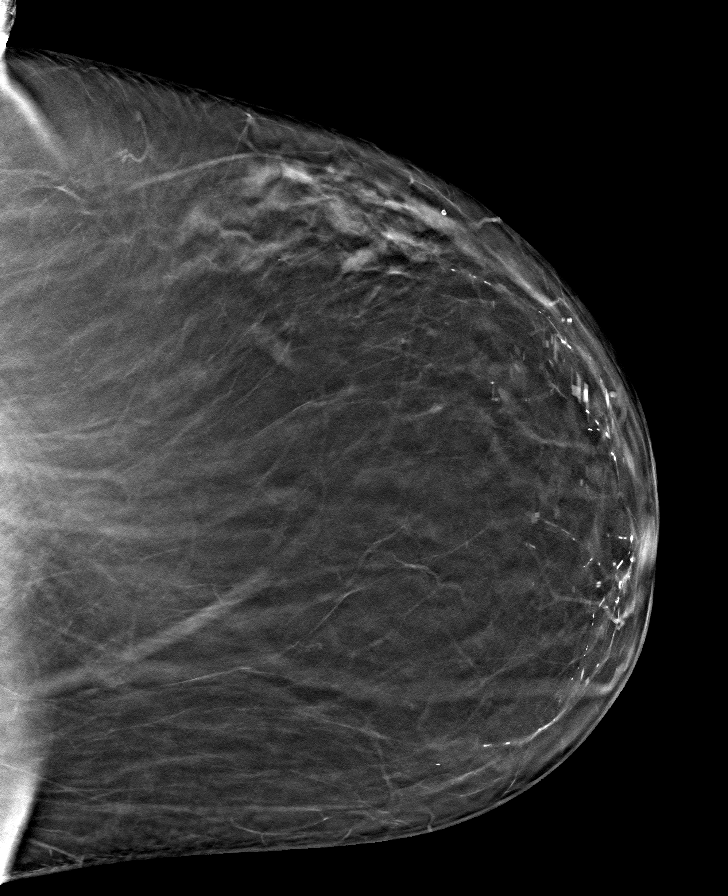

[8 of 24 positions shown; findings below may reference images not displayed]

ACR Breast Density Category b: There are scattered areas of
fibroglandular density.
FINDINGS: There are no findings suspicious for malignancy. Images were
processed with CAD.
IMPRESSION: No mammographic evidence of malignancy. A result letter of this
screening mammogram will be mailed directly to the patient.

RECOMMENDATION:
Screening mammogram in one year. (Code:[id number])

BI-RADS CATEGORY  1: Negative.

## 2019-10-29 ENCOUNTER — Other Ambulatory Visit: Payer: Self-pay | Admitting: Family Medicine

## 2019-10-29 DIAGNOSIS — Z1231 Encounter for screening mammogram for malignant neoplasm of breast: Secondary | ICD-10-CM

## 2019-12-06 ENCOUNTER — Ambulatory Visit
Admission: RE | Admit: 2019-12-06 | Discharge: 2019-12-06 | Disposition: A | Discharge status: Home / Self Care | Payer: Medicare Other | Source: Ambulatory Visit | Attending: Family Medicine | Admitting: Family Medicine

## 2019-12-06 ENCOUNTER — Other Ambulatory Visit: Payer: Self-pay

## 2019-12-06 DIAGNOSIS — Z1231 Encounter for screening mammogram for malignant neoplasm of breast: Secondary | ICD-10-CM

## 2019-12-12 DIAGNOSIS — Z6837 Body mass index (BMI) 37.0-37.9, adult: Secondary | ICD-10-CM | POA: Diagnosis not present

## 2019-12-12 DIAGNOSIS — K420 Umbilical hernia with obstruction, without gangrene: Secondary | ICD-10-CM | POA: Insufficient documentation

## 2019-12-12 DIAGNOSIS — I10 Essential (primary) hypertension: Secondary | ICD-10-CM | POA: Diagnosis not present

## 2019-12-12 HISTORY — DX: Umbilical hernia with obstruction, without gangrene: K42.0

## 2019-12-20 ENCOUNTER — Encounter: Payer: Self-pay | Admitting: Cardiology

## 2019-12-20 DIAGNOSIS — I10 Essential (primary) hypertension: Secondary | ICD-10-CM | POA: Diagnosis not present

## 2019-12-20 DIAGNOSIS — Z0000 Encounter for general adult medical examination without abnormal findings: Secondary | ICD-10-CM | POA: Diagnosis not present

## 2019-12-20 DIAGNOSIS — E785 Hyperlipidemia, unspecified: Secondary | ICD-10-CM | POA: Diagnosis not present

## 2019-12-20 DIAGNOSIS — R7301 Impaired fasting glucose: Secondary | ICD-10-CM | POA: Diagnosis not present

## 2019-12-20 DIAGNOSIS — Z79899 Other long term (current) drug therapy: Secondary | ICD-10-CM | POA: Diagnosis not present

## 2019-12-20 DIAGNOSIS — F411 Generalized anxiety disorder: Secondary | ICD-10-CM | POA: Diagnosis not present

## 2019-12-20 DIAGNOSIS — K429 Umbilical hernia without obstruction or gangrene: Secondary | ICD-10-CM | POA: Diagnosis not present

## 2019-12-28 ENCOUNTER — Other Ambulatory Visit: Payer: Self-pay

## 2019-12-30 DIAGNOSIS — Z01810 Encounter for preprocedural cardiovascular examination: Secondary | ICD-10-CM | POA: Insufficient documentation

## 2019-12-30 HISTORY — DX: Encounter for preprocedural cardiovascular examination: Z01.810

## 2019-12-30 NOTE — Progress Notes (Signed)
Cardiology Office Note:    Date:  12/31/2019   ID:  Stephanie Wiggins, DOB 03-15-1936, MRN [id number]  PCP:  Street, Sharon Mt, MD  Cardiologist:  Shirlee More, MD   Referring MD: [address] Sharon Mt, *  ASSESSMENT:    1. Preoperative cardiovascular examination   2. Chronic atrial fibrillation (HCC)   3. Chronic anticoagulation   4. Hypertensive heart disease without heart failure   5. Incarcerated umbilical hernia    PLAN:    In order of problems listed above:  Plan procedure is elective intermediate risk and the patient is optimized.  She should hold her anticoagulant for full doses prior to surgery and reinstitute depending on bleeding risk 24 to 48 hours afterwards.  Her rate is presently controlled she will continue her beta-blocker in the perioperative.  And antihypertensive medications.  She tells me she will be in the hospital overnight please do an EKG postoperative day 1 any problems call our practice to.see her.  EKG in July show T wave inversion similar pattern but more pronounced.  I will have her come back prior to her elective surgery we will do an echocardiogram regarding left ventricular hypertrophy and hypertrophic cardiomyopathy.  Next appointment 1 year   Medication Adjustments/Labs and Tests Ordered: Current medicines are reviewed at length with the patient today.  Concerns regarding medicines are outlined above.  No orders of the defined types were placed in this encounter.  No orders of the defined types were placed in this encounter.    Preoperative cardiology evaluation  History of Present Illness:    Stephanie Wiggins is a 84 y.o. female who is being seen today for preoperative evaluation with a symptomatic incarcerated umbilical hernia.  Her medical history is noteworthy for hypertension and hyperlipidemia.  She is seen today at the request of Dr Lovie Macadamia.. I had seen her at [facility] cardiology 09/14/2016 with persistent atrial  fibrillation with controlled rate and chronic anticoagulation with warfarin at that time.  Other problems include hypertension with edema on a calcium channel blocker.  Epic record 2016 reveals echocardiogram done showed mild concentric LVH normal ejection fraction normal left atrial size and mild mitral regurgitation.  A Holter monitor showed rate controlled atrial fibrillation. She was last seen by me 04/25/2019.  She is pending elective umbilical hernia repair with mesh General anesthesia.  She will need to hold her anticoagulant for doses 2 days prior to surgery surgery to give her instructions when to resume postoperatively.  She does not require further preoperative cardiovascular testing.  She is not having pain but the hernias output remains out and has not had palpitation edema shortness of breath or syncope.  She tolerates her anticoagulant without bleeding complication.  Recent labs 12/20/2019 showed her lipids to be ideal cholesterol 133 HDL 40 LDL 72 A1c at target 5.8% and normal renal function mean 1.0. Past Medical History:  Diagnosis Date  . Anticoagulant long-term use   . Anxiety 10/24/2017  . Anxiety   . Atrial fibrillation ([place])   . Chronic anticoagulation 10/27/2017  . Chronic atrial fibrillation ([place]) 07/15/2015   Overview:  CHADS2 vasc score= 4 Echo with mild LVH, normal EF% and mild MR, LA normal Holter with rate controlled AF  . Dyslipidemia   . Elevated fasting glucose   . Esophageal reflux 10/24/2017  . GERD (gastroesophageal reflux disease)   . Hyperlipidemia 10/24/2017  . Hypertension 10/24/2017  . Hypertensive heart disease 10/24/2017  . Hypokalemia   . Incarcerated umbilical hernia 0/16/0109  .  Persistent atrial fibrillation ([place]) 07/15/2015   Overview:  CHADS2 vasc score= 4 Echo with mild LVH, normal EF% and mild MR, LA normal Holter with rate controlled AF    Past Surgical History:  Procedure Laterality Date  . BREAST EXCISIONAL BIOPSY Right 40 yrs   benign  .  CATARACT EXTRACTION    . DILATION AND CURETTAGE OF UTERUS      Current Medications: Current Meds  Medication Sig  . apixaban (ELIQUIS) 5 MG TABS tablet Take 5 mg by mouth 2 (two) times daily.  . Calcium Carbonate-Vit D-Min (RA CALCIUM 600/VIT D/MINERALS) 600-200 MG-UNIT TABS Take 200 mg by mouth daily.  . carvedilol (COREG) 25 MG tablet Take 25 mg by mouth 2 (two) times daily with a meal. 1 in the am and 1 at night  . cetirizine (ZYRTEC) 10 MG tablet Take 10 mg by mouth 2 (two) times daily.  . Cholecalciferol (VITAMIN D3) 75 MCG (3000 UT) TABS Take 1 tablet by mouth daily.  . clorazepate (TRANXENE) 3.75 MG tablet Take 3.75 mg by mouth 2 (two) times daily as needed for anxiety.  Marland Kitchen ELIQUIS 5 MG TABS tablet Take 5 mg by mouth 2 (two) times daily.  Marland Kitchen escitalopram (LEXAPRO) 10 MG tablet Take 10 mg by mouth daily.  Marland Kitchen escitalopram (LEXAPRO) 10 MG tablet Take 0.5 mg by mouth daily.  . Ferrous Sulfate (IRON) 325 (65 Fe) MG TABS Take 1 tablet by mouth daily.  . fluticasone (FLONASE) 50 MCG/ACT nasal spray 1 spray by Each Nare route daily as needed.  . furosemide (LASIX) 40 MG tablet Take 40 mg by mouth.  . losartan (COZAAR) 100 MG tablet Take 100 mg by mouth daily.  . Multiple Vitamins-Minerals (PRESERVISION AREDS) CAPS Take 1 capsule by mouth daily.  . Multiple Vitamins-Minerals (PRESERVISION/LUTEIN PO) Take 1 tablet by mouth daily.  Marland Kitchen omeprazole (PRILOSEC OTC) 20 MG tablet Take 10 mg by mouth daily.  . potassium chloride (KLOR-CON) 10 MEQ tablet Take 20 mEq by mouth daily.  . simvastatin (ZOCOR) 40 MG tablet Take 40 mg by mouth at bedtime.  . vitamin B-12 (CYANOCOBALAMIN) 100 MCG tablet Take 100 mcg by mouth daily.  . [DISCONTINUED] omeprazole (PRILOSEC OTC) 20 MG tablet Take 20 mg by mouth daily.     Allergies:   Penicillins and Sulfa antibiotics   Social History   Socioeconomic History  . Marital status: Married    Spouse name: Not on file  . Number of children: Not on file  . Years of  education: Not on file  . Highest education level: Not on file  Occupational History  . Not on file  Tobacco Use  . Smoking status: Never Smoker  . Smokeless tobacco: Never Used  Substance and Sexual Activity  . Alcohol use: No  . Drug use: No  . Sexual activity: Not on file  Other Topics Concern  . Not on file  Social History Narrative  . Not on file   Social Determinants of Health   Financial Resource Strain:   . Difficulty of Paying Living Expenses:   Food Insecurity:   . Worried About Charity fundraiser in the Last Year:   . Arboriculturist in the Last Year:   Transportation Needs:   . Film/video editor (Medical):   Marland Kitchen Lack of Transportation (Non-Medical):   Physical Activity:   . Days of Exercise per Week:   . Minutes of Exercise per Session:   Stress:   . Feeling of Stress :  Social Connections:   . Frequency of Communication with Friends and Family:   . Frequency of Social Gatherings with Friends and Family:   . Attends Religious Services:   . Active Member of Clubs or Organizations:   . Attends Archivist Meetings:   Marland Kitchen Marital Status:      Family History: The patient's family history includes Diabetes in her brother; Heart attack in her father; Hypertension in her brother and sister; Stroke in her mother.  ROS:   ROS Please see the history of present illness.     All other systems reviewed and are negative.  EKGs/Labs/Other Studies Reviewed:    The following studies were reviewed today:   EKG:  EKG is  ordered today.  The ekg ordered today is personally reviewed and demonstrates fibrillation LVH marked repolarization changes similar to but more pronounced in the echocardiogram in July 2020.  She will havea preoperative echocardiogram regarding hypertrophic cardiomyopathy  Recent Labs: 12/20/2019: CBC normal hemoglobin 16.5 A1c 5.8% at target Lipid profile with a cholesterol 133 triglycerides 106 HDL 48 LDL 76 at target CMP is normal  except for sodium mildly diminished 131 GFR greater than 60 cc normal liver function test  Physical Exam:    VS:  BP (!) 178/92   Pulse 80   Temp 97.7 F (36.5 C)   Ht 5\' 4"  (1.626 m)   Wt 208 lb 12.8 oz (94.7 kg)   SpO2 96%   BMI 35.84 kg/m     Wt Readings from Last 3 Encounters:  12/31/19 208 lb 12.8 oz (94.7 kg)  04/25/19 220 lb 9.6 oz (100.1 kg)  10/27/17 227 lb (103 kg)     GEN:  Well nourished, well developed in no acute distress HEENT: Normal NECK: No JVD; No carotid bruits LYMPHATICS: No lymphadenopathy CARDIAC: Irregular S1 variable no murmurs, rubs, gallops RESPIRATORY:  Clear to auscultation without rales, wheezing or rhonchi  ABDOMEN: Soft, non-tender, non-distended MUSCULOSKELETAL:  No edema; No deformity  SKIN: Warm and dry NEUROLOGIC:  Alert and oriented x 3 PSYCHIATRIC:  Normal affect     Signed, Shirlee More, MD  12/31/2019 2:23 PM    [place]

## 2019-12-31 ENCOUNTER — Encounter: Payer: Self-pay | Admitting: Cardiology

## 2019-12-31 ENCOUNTER — Telehealth: Payer: Self-pay

## 2019-12-31 ENCOUNTER — Ambulatory Visit (INDEPENDENT_AMBULATORY_CARE_PROVIDER_SITE_OTHER): Payer: Medicare Other | Admitting: Cardiology

## 2019-12-31 ENCOUNTER — Other Ambulatory Visit: Payer: Self-pay

## 2019-12-31 VITALS — BP 152/64 | HR 80 | Temp 97.7°F | Ht 64.0 in | Wt 208.8 lb

## 2019-12-31 DIAGNOSIS — R9431 Abnormal electrocardiogram [ECG] [EKG]: Secondary | ICD-10-CM | POA: Diagnosis not present

## 2019-12-31 DIAGNOSIS — Z7901 Long term (current) use of anticoagulants: Secondary | ICD-10-CM | POA: Diagnosis not present

## 2019-12-31 DIAGNOSIS — I119 Hypertensive heart disease without heart failure: Secondary | ICD-10-CM | POA: Diagnosis not present

## 2019-12-31 DIAGNOSIS — I482 Chronic atrial fibrillation, unspecified: Secondary | ICD-10-CM

## 2019-12-31 DIAGNOSIS — K420 Umbilical hernia with obstruction, without gangrene: Secondary | ICD-10-CM

## 2019-12-31 DIAGNOSIS — I422 Other hypertrophic cardiomyopathy: Secondary | ICD-10-CM

## 2019-12-31 DIAGNOSIS — Z01810 Encounter for preprocedural cardiovascular examination: Secondary | ICD-10-CM | POA: Diagnosis not present

## 2019-12-31 NOTE — Patient Instructions (Signed)
Medication Instructions:  Your physician recommends that you continue on your current medications as directed. Please refer to the Current Medication list given to you today.  Please hold your Elequis for 4 full doses (Two days) prior to your surgery.   *If you need a refill on your cardiac medications before your next appointment, please call your pharmacy*   Lab Work: None If you have labs (blood work) drawn today and your tests are completely normal, you will receive your results only by: Marland Kitchen MyChart Message (if you have MyChart) OR . A paper copy in the mail If you have any lab test that is abnormal or we need to change your treatment, we will call you to review the results.   Testing/Procedures: None   Follow-Up: At [facility], you and your health needs are our priority.  As part of our continuing mission to provide you with exceptional heart care, we have created designated Provider Care Teams.  These Care Teams include your primary Cardiologist (physician) and Advanced Practice Providers (APPs -  Physician Assistants and Nurse Practitioners) who all work together to provide you with the care you need, when you need it.  We recommend signing up for the patient portal called "MyChart".  Sign up information is provided on this After Visit Summary.  MyChart is used to connect with patients for Virtual Visits (Telemedicine).  Patients are able to view lab/test results, encounter notes, upcoming appointments, etc.  Non-urgent messages can be sent to your provider as well.   To learn more about what you can do with MyChart, go to [email or web address].    Your next appointment:   1 year(s)  The format for your next appointment:   In Person  Provider:   Shirlee More, MD   Other Instructions

## 2019-12-31 NOTE — Telephone Encounter (Signed)
Left message on patients voicemail to let her know that we have her scheduled for an echo on 01/09/20 at 1:15pm.    Encouraged patient to call back with any questions or concerns.

## 2020-01-09 ENCOUNTER — Other Ambulatory Visit: Payer: Self-pay

## 2020-01-09 ENCOUNTER — Ambulatory Visit (INDEPENDENT_AMBULATORY_CARE_PROVIDER_SITE_OTHER): Payer: Medicare Other

## 2020-01-09 DIAGNOSIS — R9431 Abnormal electrocardiogram [ECG] [EKG]: Secondary | ICD-10-CM | POA: Diagnosis not present

## 2020-01-09 NOTE — Progress Notes (Signed)
Echocardiogram completed.

## 2020-01-10 ENCOUNTER — Telehealth: Payer: Self-pay

## 2020-01-10 NOTE — Addendum Note (Signed)
Addended by: Resa Miner I on: 01/10/2020 12:09 PM   Modules accepted: Orders

## 2020-01-10 NOTE — Telephone Encounter (Signed)
-----   Message from Richardo Priest, MD sent at 01/10/2020 12:05 PM EDT ----- Normal or stable result  This is the patient from yesterday that I like to come back to [facility] echo with contrast regarding apical hypertrophic cardiomyopathy it is a preoperative test and like to schedule as quickly as we can.

## 2020-01-10 NOTE — Telephone Encounter (Signed)
Spoke with patient regarding results and recommendation for her to come back for a limited echo. She is scheduled for 01/16/20 at 4:15 pm.   Patient verbalizes understanding and is agreeable to plan of care. Advised patient to call back with any issues or concerns.

## 2020-01-14 ENCOUNTER — Other Ambulatory Visit: Payer: Self-pay

## 2020-01-14 ENCOUNTER — Telehealth: Payer: Self-pay

## 2020-01-14 ENCOUNTER — Other Ambulatory Visit (HOSPITAL_COMMUNITY): Payer: Self-pay | Admitting: Cardiology

## 2020-01-14 ENCOUNTER — Ambulatory Visit (INDEPENDENT_AMBULATORY_CARE_PROVIDER_SITE_OTHER): Payer: Medicare Other

## 2020-01-14 DIAGNOSIS — I422 Other hypertrophic cardiomyopathy: Secondary | ICD-10-CM | POA: Diagnosis not present

## 2020-01-14 NOTE — Telephone Encounter (Signed)
Spoke with patient in regards to these results. Patient is scheduled to come in tomorrow 01/15/20 at 1:35pm.    Encouraged patient to call back with any questions or concerns.

## 2020-01-14 NOTE — Progress Notes (Unsigned)
2D Echocardiogram has been performed. Used 18ml of definity contrast. 10:54 01/14/20 [place]

## 2020-01-14 NOTE — Telephone Encounter (Signed)
-----   Message from Richardo Priest, MD sent at 01/14/2020 12:31 PM EDT ----- Please have her see me tomorrow Wednesday for her echocardiogram which is abnormal

## 2020-01-15 ENCOUNTER — Encounter: Payer: Self-pay | Admitting: Cardiology

## 2020-01-15 ENCOUNTER — Ambulatory Visit (INDEPENDENT_AMBULATORY_CARE_PROVIDER_SITE_OTHER): Payer: Medicare Other

## 2020-01-15 ENCOUNTER — Ambulatory Visit (INDEPENDENT_AMBULATORY_CARE_PROVIDER_SITE_OTHER): Payer: Medicare Other | Admitting: Cardiology

## 2020-01-15 VITALS — BP 200/110 | HR 71 | Ht 64.0 in | Wt 205.0 lb

## 2020-01-15 DIAGNOSIS — I482 Chronic atrial fibrillation, unspecified: Secondary | ICD-10-CM | POA: Diagnosis not present

## 2020-01-15 DIAGNOSIS — I422 Other hypertrophic cardiomyopathy: Secondary | ICD-10-CM

## 2020-01-15 DIAGNOSIS — I119 Hypertensive heart disease without heart failure: Secondary | ICD-10-CM

## 2020-01-15 DIAGNOSIS — Z7901 Long term (current) use of anticoagulants: Secondary | ICD-10-CM

## 2020-01-15 HISTORY — DX: Other hypertrophic cardiomyopathy: I42.2

## 2020-01-15 MED ORDER — TELMISARTAN 40 MG PO TABS: 40.0000 mg | ORAL_TABLET | Freq: Every day | ORAL | 3 refills | Status: DC

## 2020-01-15 MED ORDER — CLONIDINE HCL 0.1 MG PO TABS: 0.1000 mg | ORAL_TABLET | Freq: Every day | ORAL | 3 refills | Status: DC

## 2020-01-15 NOTE — Progress Notes (Signed)
Cardiology Office Note:    Date:  01/15/2020   ID:  Stephanie Wiggins, DOB 10/01/1935, MRN [id number]  PCP:  Street, Sharon Mt, MD  Cardiologist:  Shirlee More, MD    Referring MD: [address], Sharon Mt, *    ASSESSMENT:    1. Apical variant hypertrophic cardiomyopathy ([place])   2. Chronic atrial fibrillation (HCC)   3. Chronic anticoagulation   4. Hypertensive heart disease without heart failure    PLAN:    In order of problems listed above:  1. She clearly has typical EKG and echo findings of nonobstructive apical variant hypertrophic cardiomyopathy.  Generally this is a genetic disease that affects younger individuals predominantly males although it occurs sporadically and at times acquired is an exaggerated response to hypertension.  I think that is her case.  We could do further evaluation looking for markers of sudden death like late gadolinium enhancement equal scar she declines but we will place a 3-day monitor looking for DVT.  If present I think she should get a cardiac MR done.  At this time I would proceed with her planned surgery although she is quite hypertensive alter medications to more potent ARB add clonidine check at home and contact me if systolics remain greater than 160.  I do not think she requires screening of her family members with age of diagnosis over 6.  Her atrial fibrillation is rate controlled she will withdraw her anticoagulant perioperatively.  If she stays in the hospital overnight please check an EKG postoperative day 1 and contact rounding doctor if  We need to see her.   Next appointment: 3 months   Medication Adjustments/Labs and Tests Ordered: Current medicines are reviewed at length with the patient today.  Concerns regarding medicines are outlined above.  No orders of the defined types were placed in this encounter.  No orders of the defined types were placed in this encounter.   Chief Complaint  Patient presents with  . Follow-up     Testing  . Abnormal ECG    History of Present Illness:    Stephanie Wiggins is a 84 y.o. female with a hx of atrial fibrillation permanent with chronic anticoagulation hypertension and recently recognized apical hypertrophic cardiomyopathy. She wants last seen 01/2020.  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 Compliance with diet, lifestyle and medications: Yes  She returns my office I explained that her EKG was indeed the typical finding of apical hypertrophic cardiomyopathy confirmed on her echocardiogram yesterday.  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  She had one episode of syncope about 5 years ago no recurrence is not having chest pain shortness of breath or palpitation.  Unfortunately she does not check blood pressure at home and is quite hypertensive and even repeat by me after resting is 188/100.  I will switch her to a more potent ARB add clonidine and asked her to check her blood pressures at home I think should be improved enough to undergo her planned surgical procedure.  Withdraw her anticoagulant 2 days prior to surgery.  Please restart Eliquis stop 24 to  48 hours Past Medical History:  Diagnosis Date  . Anticoagulant long-term use   . Anxiety 10/24/2017  . Anxiety   . Atrial fibrillation ([place])   . Chronic anticoagulation 10/27/2017  . Chronic atrial fibrillation ([place]) 07/15/2015   Overview:  CHADS2 vasc score= 4 Echo with mild LVH, normal EF% and mild MR, LA normal Holter with rate controlled AF  . Dyslipidemia   . Elevated fasting glucose   . Esophageal reflux 10/24/2017  . GERD  (gastroesophageal reflux disease)   . Hyperlipidemia 10/24/2017  . Hypertension 10/24/2017  . Hypertensive heart disease 10/24/2017  . Hypokalemia   . Incarcerated umbilical hernia 1/51/7616  . Persistent atrial fibrillation ([place]) 07/15/2015   Overview:  CHADS2 vasc score= 4 Echo with mild LVH, normal EF% and mild MR, LA normal Holter with rate controlled AF    Past Surgical History:  Procedure Laterality Date  . BREAST EXCISIONAL BIOPSY Right 40 yrs   benign  . CATARACT EXTRACTION    . DILATION AND CURETTAGE OF UTERUS      Current Medications: Current Meds  Medication Sig  . apixaban (ELIQUIS) 5 MG TABS tablet Take 5 mg by mouth 2 (two) times daily.  . Calcium Carbonate-Vit D-Min (RA CALCIUM 600/VIT D/MINERALS) 600-200 MG-UNIT TABS Take 200 mg by mouth daily.  . carvedilol (COREG) 25 MG tablet Take 25 mg by mouth 2 (two) times daily with a meal. 1 in the am and 1 at night  . cetirizine (ZYRTEC) 10 MG tablet Take 10 mg by mouth 2 (two) times daily.  . Cholecalciferol (VITAMIN D3) 75 MCG (3000 UT) TABS Take 1 tablet by mouth daily.  . clorazepate (TRANXENE) 3.75 MG tablet Take 3.75 mg by mouth 2 (two) times daily as needed for anxiety.  Marland Kitchen ELIQUIS 5 MG TABS tablet Take 5 mg by mouth 2 (two) times daily.  Marland Kitchen escitalopram (LEXAPRO) 10 MG tablet Take 0.5 mg by mouth daily.  . Ferrous Sulfate (IRON) 325 (65 Fe) MG TABS Take 1 tablet by mouth daily.  . fluticasone (FLONASE) 50 MCG/ACT nasal spray 1 spray by Each Nare route daily as needed.  . furosemide (LASIX) 40 MG tablet Take 40 mg by mouth.  . Multiple Vitamins-Minerals (PRESERVISION AREDS) CAPS Take 1 capsule by mouth daily.  Marland Kitchen omeprazole (PRILOSEC OTC) 20 MG tablet Take 10 mg by mouth daily.  . potassium chloride (KLOR-CON) 10 MEQ tablet Take 20 mEq by mouth daily.  . simvastatin (ZOCOR) 40 MG tablet Take 40 mg by mouth at bedtime.  . vitamin B-12 (CYANOCOBALAMIN) 100 MCG tablet Take 100 mcg by mouth daily.  . [DISCONTINUED] losartan  (COZAAR) 100 MG tablet Take 100 mg by mouth daily.     Allergies:   Penicillins and Sulfa antibiotics   Social History   Socioeconomic History  . Marital status: Married    Spouse name: Not on file  . Number of children: Not on file  . Years of education: Not on file  . Highest education level: Not on file  Occupational History  . Not on file  Tobacco Use  . Smoking status: Never Smoker  . Smokeless tobacco: Never Used  Substance and Sexual Activity  . Alcohol use: No  . Drug use: No  . Sexual activity: Not on file  Other Topics Concern  . Not on file  Social History Narrative  . Not on file   Social Determinants of Health   Financial Resource Strain:   . Difficulty of Paying Living Expenses:  Food Insecurity:   . Worried About Charity fundraiser in the Last Year:   . Arboriculturist in the Last Year:   Transportation Needs:   . Film/video editor (Medical):   Marland Kitchen Lack of Transportation (Non-Medical):   Physical Activity:   . Days of Exercise per Week:   . Minutes of Exercise per Session:   Stress:   . Feeling of Stress :   Social Connections:   . Frequency of Communication with Friends and Family:   . Frequency of Social Gatherings with Friends and Family:   . Attends Religious Services:   . Active Member of Clubs or Organizations:   . Attends Archivist Meetings:   Marland Kitchen Marital Status:      Family History: The patient's family history includes Diabetes in her brother; Heart attack in her father; Hypertension in her brother and sister; Stroke in her mother. ROS:   Please see the history of present illness.    All other systems reviewed and are negative.  EKGs/Labs/Other Studies Reviewed:    The following studies were reviewed today:   Recent Labs: No results found for requested labs within last 8760 hours.  Recent Lipid Panel No results found for: CHOL, TRIG, HDL, CHOLHDL, VLDL, LDLCALC, LDLDIRECT  Physical Exam:    VS:  BP (!) 200/110  (BP Location: Right Arm, Patient Position: Sitting, Cuff Size: Large)   Pulse 71   Ht 5\' 4"  (1.626 m)   Wt 205 lb (93 kg)   SpO2 98%   BMI 35.19 kg/m     Wt Readings from Last 3 Encounters:  01/15/20 205 lb (93 kg)  12/31/19 208 lb 12.8 oz (94.7 kg)  04/25/19 220 lb 9.6 oz (100.1 kg)     GEN:  Well nourished, well developed in no acute distress HEENT: Normal NECK: No JVD; No carotid bruits LYMPHATICS: No lymphadenopathy CARDIAC: Irregular S1 variable , no murmurs, rubs, gallops RESPIRATORY:  Clear to auscultation without rales, wheezing or rhonchi  ABDOMEN: Soft, non-tender, non-distended MUSCULOSKELETAL:  No edema; No deformity  SKIN: Warm and dry NEUROLOGIC:  Alert and oriented x 3 PSYCHIATRIC:  Normal affect    Signed, Shirlee More, MD  01/15/2020 1:59 PM    [facility]

## 2020-01-15 NOTE — Patient Instructions (Addendum)
Medication Instructions:  Your physician has recommended you make the following change in your medication:  STOP: Losartan START: Telmisartan 40 mg take one tablet by mouth daily.  START: Clonidine 0.1 mg take one tablet by mouth at bedtime.  *If you need a refill on your cardiac medications before your next appointment, please call your pharmacy*   Lab Work: None If you have labs (blood work) drawn today and your tests are completely normal, you will receive your results only by: Marland Kitchen MyChart Message (if you have MyChart) OR . A paper copy in the mail If you have any lab test that is abnormal or we need to change your treatment, we will call you to review the results.   Testing/Procedures: A zio monitor was ordered today. It will remain on for 3 days. You will then return monitor and event diary in provided box. It takes 1-2 weeks for report to be downloaded and returned to [place]. We will call you with the results. If monitor falls off or has orange flashing light, please call Zio for further instructions.      Follow-Up: At [facility], you and your health needs are our priority.  As part of our continuing mission to provide you with exceptional heart care, we have created designated Provider Care Teams.  These Care Teams include your primary Cardiologist (physician) and Advanced Practice Providers (APPs -  Physician Assistants and Nurse Practitioners) who all work together to provide you with the care you need, when you need it.  We recommend signing up for the patient portal called "MyChart".  Sign up information is provided on this After Visit Summary.  MyChart is used to connect with patients for Virtual Visits (Telemedicine).  Patients are able to view lab/test results, encounter notes, upcoming appointments, etc.  Non-urgent messages can be sent to your provider as well.   To learn more about what you can do with MyChart, go to [email or web address].    Your next appointment:   3  month(s)  The format for your next appointment:   In Person  Provider:   Shirlee More, MD   Other Instructions Please stop your eliquis for two full days prior to your surgery.   Please call us next week if your blood pressure is greater than 160 on the top.

## 2020-01-16 ENCOUNTER — Other Ambulatory Visit: Payer: Medicare Other

## 2020-01-18 ENCOUNTER — Telehealth: Payer: Self-pay

## 2020-01-18 NOTE — Telephone Encounter (Signed)
   [facility] Pre-operative Risk Assessment    Request for surgical clearance:  1. What type of surgery is being performed? Hernia Repair with Mesh   2. When is this surgery scheduled? TBD   3. What type of clearance is required (medical clearance vs. Pharmacy clearance to hold med vs. Both)? MedicalPharmacy  4. Are there any medications that need to be held prior to surgery and how long? Eliquis   5. Practice name and name of physician performing surgery? [facility]   6. What is your office phone number [phone]    7.   What is your office fax number [phone]  8.   Anesthesia type (None, local, MAC, general) ? Unspecified   Gita Kudo 01/18/2020, 4:18 PM  _________________________________________________________________   (provider comments below)

## 2020-01-21 NOTE — Telephone Encounter (Signed)
Patient with diagnosis of afib on Eliquis for anticoagulation.    Procedure:  Hernia Repair with Mesh  Date of procedure: TBD  CHADS2-VASc score of  4 (HTN, AGE, AGE, female)  CrCl 48 ml/min  Per office protocol, patient can hold Eliquis for 2 days prior to procedure.

## 2020-01-22 NOTE — Telephone Encounter (Signed)
   Primary Cardiologist: Shirlee More, MD  Chart reviewed as part of pre-operative protocol coverage.   The was seen by Dr. Bettina Gavia on 01/15/20 and cleared as "At this time I would proceed with her planned surgery although she is quite hypertensive alter medications to more potent ARB add clonidine check at home and contact me if systolics remain greater than 160.  I do not think she requires screening of her family members with age of diagnosis over 20.  Her atrial fibrillation is rate controlled she will withdraw her anticoagulant perioperatively.  If she stays in the hospital overnight please check an EKG postoperative day 1 and contact rounding doctor if  We need to see her.  I will route this recommendation to the requesting party via Epic fax function and remove from pre-op pool.  Please call with questions.  [place], [place] 01/22/2020, 8:39 AM

## 2020-01-31 DIAGNOSIS — R002 Palpitations: Secondary | ICD-10-CM | POA: Diagnosis not present

## 2020-02-11 ENCOUNTER — Telehealth: Payer: Self-pay

## 2020-02-11 NOTE — Telephone Encounter (Signed)
-----   Message from Richardo Priest, MD sent at 02/09/2020 11:28 AM EDT ----- Normal or stable result  Good result, we did this because of her hypertrophic cardiomyopathy and there is no concerning heart rhythms seen.

## 2020-02-11 NOTE — Telephone Encounter (Signed)
Spoke with patient regarding results and recommendation.  Patient verbalizes understanding and is agreeable to plan of care. Advised patient to call back with any issues or concerns.  

## 2020-02-18 DIAGNOSIS — S40022A Contusion of left upper arm, initial encounter: Secondary | ICD-10-CM | POA: Diagnosis not present

## 2020-04-13 NOTE — Progress Notes (Signed)
Cardiology Office Note:    Date:  04/15/2020   ID:  Stephanie Wiggins, DOB 1936-07-04, MRN [id number]  PCP:  Street, [place], MD  Cardiologist:  Shirlee More, MD    Referring MD: [address] [place] Mt, *    ASSESSMENT:    1. Apical variant hypertrophic cardiomyopathy ([place])   2. Chronic atrial fibrillation (HCC)   3. Chronic anticoagulation   4. Hypertensive heart disease without heart failure    PLAN:    In order of problems listed above:  1. Asymptomatic fortunately she has no obstruction her real problem here is severe resistant hypertension she take an extra tablet of clonidine when she gets home continue her multidrug regimen add calcium channel blocker amlodipine check on blood pressures twice daily and follow-up in the office 1 month 2. Stable rate controlled to new beta-blocker anticoagulant 3. Poorly controlled hypertension add calcium channel blocker   Next appointment: 4 weeks   Medication Adjustments/Labs and Tests Ordered: Current medicines are reviewed at length with the patient today.  Concerns regarding medicines are outlined above.  No orders of the defined types were placed in this encounter.  No orders of the defined types were placed in this encounter.   Chief Complaint  Patient presents with   Follow-up   Cardiomyopathy    History of Present Illness:    Stephanie Wiggins is a 84 y.o. female with a hx of hypertrophic cardiomyopathy apical variant permanent atrial fibrillation chronic anticoagulation and hyper tension.  She was last seen January 15, 2020.  With concern of apical aneurysm we discussed cardiac MRI and she declined.  History of remote syncope and underwent a 3-day ZIO monitor which showed rare ventricular ectopy and no high risk findings.  She has no family history of hypertrophic cardiomyopathy. Compliance with diet, lifestyle and medications: Yes however she is eating high salt food  She does not check her blood  pressure at home unaware was elevated in the office and repeat by me 190/100.  She is on multidrug regimen for severe hypertension is having trouble tolerating clonidine with dry mouth I will add a calcium channel blocker and told her to take an extra tablet and she gets home and fully sodium restrict.  No chest pain palpitation or syncope. Past Medical History:  Diagnosis Date   Anticoagulant long-term use    Anxiety 10/24/2017   Anxiety    Atrial fibrillation (HCC)    Chronic anticoagulation 10/27/2017   Chronic atrial fibrillation ([place]) 07/15/2015   Overview:  CHADS2 vasc score= 4 Echo with mild LVH, normal EF% and mild MR, LA normal Holter with rate controlled AF   Dyslipidemia    Elevated fasting glucose    Esophageal reflux 10/24/2017   GERD (gastroesophageal reflux disease)    Hyperlipidemia 10/24/2017   Hypertension 10/24/2017   Hypertensive heart disease 10/24/2017   Hypokalemia    Incarcerated umbilical hernia 12/24/5186   Persistent atrial fibrillation ([place]) 07/15/2015   Overview:  CHADS2 vasc score= 4 Echo with mild LVH, normal EF% and mild MR, LA normal Holter with rate controlled AF    Past Surgical History:  Procedure Laterality Date   BREAST EXCISIONAL BIOPSY Right 40 yrs   benign   CATARACT EXTRACTION     DILATION AND CURETTAGE OF UTERUS      Current Medications: Current Meds  Medication Sig   apixaban (ELIQUIS) 5 MG TABS tablet Take 5 mg by mouth 2 (two) times daily.   Calcium Carbonate-Vit D-Min (RA CALCIUM 600/VIT  D/MINERALS) 600-200 MG-UNIT TABS Take 200 mg by mouth daily.   carvedilol (COREG) 25 MG tablet Take 25 mg by mouth 2 (two) times daily with a meal. 1 in the am and 1 at night   cetirizine (ZYRTEC) 10 MG tablet Take 10 mg by mouth 2 (two) times daily.   Cholecalciferol (VITAMIN D3) 75 MCG (3000 UT) TABS Take 1 tablet by mouth daily.   cloNIDine (CATAPRES) 0.1 MG tablet Take 1 tablet (0.1 mg total) by mouth at bedtime.    clorazepate (TRANXENE) 3.75 MG tablet Take 3.75 mg by mouth 2 (two) times daily as needed for anxiety.   ELIQUIS 5 MG TABS tablet Take 5 mg by mouth 2 (two) times daily.   escitalopram (LEXAPRO) 10 MG tablet Take 0.5 mg by mouth daily.   Ferrous Sulfate (IRON) 325 (65 Fe) MG TABS Take 1 tablet by mouth daily.   fluticasone (FLONASE) 50 MCG/ACT nasal spray 1 spray by Each Nare route daily as needed.   furosemide (LASIX) 40 MG tablet Take 40 mg by mouth.   Multiple Vitamins-Minerals (PRESERVISION AREDS) CAPS Take 1 capsule by mouth daily.   omeprazole (PRILOSEC) 20 MG capsule Take 20 mg by mouth daily.   potassium chloride (KLOR-CON) 10 MEQ tablet Take 20 mEq by mouth daily.   simvastatin (ZOCOR) 40 MG tablet Take 40 mg by mouth at bedtime.   telmisartan (MICARDIS) 40 MG tablet Take 1 tablet (40 mg total) by mouth daily.   vitamin B-12 (CYANOCOBALAMIN) 100 MCG tablet Take 100 mcg by mouth daily.     Allergies:   Penicillins and Sulfa antibiotics   Social History   Socioeconomic History   Marital status: Married    Spouse name: Not on file   Number of children: Not on file   Years of education: Not on file   Highest education level: Not on file  Occupational History   Not on file  Tobacco Use   Smoking status: Never Smoker   Smokeless tobacco: Never Used  Vaping Use   Vaping Use: Never used  Substance and Sexual Activity   Alcohol use: No   Drug use: No   Sexual activity: Not on file  Other Topics Concern   Not on file  Social History Narrative   Not on file   Social Determinants of Health   Financial Resource Strain:    Difficulty of Paying Living Expenses:   Food Insecurity:    Worried About Charity fundraiser in the Last Year:    Arboriculturist in the Last Year:   Transportation Needs:    Film/video editor (Medical):    Lack of Transportation (Non-Medical):   Physical Activity:    Days of Exercise per Week:    Minutes of  Exercise per Session:   Stress:    Feeling of Stress :   Social Connections:    Frequency of Communication with Friends and Family:    Frequency of Social Gatherings with Friends and Family:    Attends Religious Services:    Active Member of Clubs or Organizations:    Attends Archivist Meetings:    Marital Status:      Family History: The patient's family history includes Diabetes in her brother; Heart attack in her father; Hypertension in her brother and sister; Stroke in her mother. ROS:   Please see the history of present illness.    All other systems reviewed and are negative.  EKGs/Labs/Other Studies Reviewed:  The following studies were reviewed today:    Recent Labs: No results found for requested labs within last 8760 hours.  Recent Lipid Panel No results found for: CHOL, TRIG, HDL, CHOLHDL, VLDL, LDLCALC, LDLDIRECT  Physical Exam:    VS:  BP (!) 172/124    Pulse 80    Ht 5\' 4"  (1.626 m)    Wt 210 lb 6.4 oz (95.4 kg)    SpO2 97%    BMI 36.12 kg/m     Wt Readings from Last 3 Encounters:  04/15/20 210 lb 6.4 oz (95.4 kg)  01/15/20 205 lb (93 kg)  12/31/19 208 lb 12.8 oz (94.7 kg)     GEN:  Well nourished, well developed in no acute distress HEENT: Normal NECK: No JVD; No carotid bruits LYMPHATICS: No lymphadenopathy CARDIAC: RRR, no murmurs, rubs, gallops RESPIRATORY:  Clear to auscultation without rales, wheezing or rhonchi  ABDOMEN: Soft, non-tender, non-distended MUSCULOSKELETAL:  No edema; No deformity  SKIN: Warm and dry NEUROLOGIC:  Alert and oriented x 3 PSYCHIATRIC:  Normal affect    Signed, Shirlee More, MD  04/15/2020 2:42 PM    [facility]

## 2020-04-15 ENCOUNTER — Ambulatory Visit (INDEPENDENT_AMBULATORY_CARE_PROVIDER_SITE_OTHER): Payer: Medicare Other | Admitting: Cardiology

## 2020-04-15 ENCOUNTER — Encounter: Payer: Self-pay | Admitting: Cardiology

## 2020-04-15 ENCOUNTER — Other Ambulatory Visit: Payer: Self-pay

## 2020-04-15 ENCOUNTER — Other Ambulatory Visit: Payer: Self-pay | Admitting: Cardiology

## 2020-04-15 VITALS — BP 172/124 | HR 80 | Ht 64.0 in | Wt 210.4 lb

## 2020-04-15 DIAGNOSIS — I422 Other hypertrophic cardiomyopathy: Secondary | ICD-10-CM

## 2020-04-15 DIAGNOSIS — I482 Chronic atrial fibrillation, unspecified: Secondary | ICD-10-CM | POA: Diagnosis not present

## 2020-04-15 DIAGNOSIS — I119 Hypertensive heart disease without heart failure: Secondary | ICD-10-CM

## 2020-04-15 DIAGNOSIS — Z7901 Long term (current) use of anticoagulants: Secondary | ICD-10-CM | POA: Diagnosis not present

## 2020-04-15 MED ORDER — AMLODIPINE BESYLATE 5 MG PO TABS: 5.0000 mg | ORAL_TABLET | Freq: Every day | ORAL | 3 refills | Status: DC

## 2020-04-15 NOTE — Patient Instructions (Addendum)
Medication Instructions:  Your physician has recommended you make the following change in your medication:  Amlodipine 5 mg take one tablet by mouth daily.   Please take an extra tablet of clonidine when you return home. Please rest for about 30 minutes afterwards.   Check your BP at home twice daily.  *If you need a refill on your cardiac medications before your next appointment, please call your pharmacy*   Lab Work: Your physician recommends that you return for lab work in: TODAY BMP If you have labs (blood work) drawn today and your tests are completely normal, you will receive your results only by: Marland Kitchen MyChart Message (if you have MyChart) OR . A paper copy in the mail If you have any lab test that is abnormal or we need to change your treatment, we will call you to review the results.   Testing/Procedures: None   Follow-Up: At [facility], you and your health needs are our priority.  As part of our continuing mission to provide you with exceptional heart care, we have created designated Provider Care Teams.  These Care Teams include your primary Cardiologist (physician) and Advanced Practice Providers (APPs -  Physician Assistants and Nurse Practitioners) who all work together to provide you with the care you need, when you need it.  We recommend signing up for the patient portal called "MyChart".  Sign up information is provided on this After Visit Summary.  MyChart is used to connect with patients for Virtual Visits (Telemedicine).  Patients are able to view lab/test results, encounter notes, upcoming appointments, etc.  Non-urgent messages can be sent to your provider as well.   To learn more about what you can do with MyChart, go to [email or web address].    Your next appointment:   4 week(s)  The format for your next appointment:   In Person  Provider:   Shirlee More, MD   Other Instructions

## 2020-04-16 LAB — BASIC METABOLIC PANEL
BUN/Creatinine Ratio: 17 (ref 12–28)
BUN: 16 mg/dL (ref 8–27)
CO2: 29 mmol/L (ref 20–29)
Calcium: 9.6 mg/dL (ref 8.7–10.3)
Chloride: 100 mmol/L (ref 96–106)
Creatinine, Ser: 0.92 mg/dL (ref 0.57–1.00)
GFR calc Af Amer: 67 mL/min/{1.73_m2} (ref >59)
GFR calc non Af Amer: 58 mL/min/{1.73_m2} — ABNORMAL LOW (ref >59)
Glucose: 77 mg/dL (ref 65–99)
Potassium: 3.8 mmol/L (ref 3.5–5.2)
Sodium: 142 mmol/L (ref 134–144)

## 2020-04-22 ENCOUNTER — Telehealth: Payer: Self-pay

## 2020-04-22 ENCOUNTER — Telehealth: Payer: Self-pay | Admitting: Cardiology

## 2020-04-22 NOTE — Telephone Encounter (Signed)
Transferred call to [place] °

## 2020-04-22 NOTE — Telephone Encounter (Signed)
Spoke with patient regarding results and recommendation.  Patient verbalizes understanding and is agreeable to plan of care. Advised patient to call back with any issues or concerns.  

## 2020-04-22 NOTE — Telephone Encounter (Signed)
-----   Message from Richardo Priest, MD sent at 04/22/2020  9:08 AM EDT ----- Normal or stable result

## 2020-04-22 NOTE — Telephone Encounter (Signed)
Left message on patients voicemail to please return our call.   

## 2020-05-14 ENCOUNTER — Other Ambulatory Visit: Payer: Self-pay | Admitting: Cardiology

## 2020-05-15 DIAGNOSIS — E785 Hyperlipidemia, unspecified: Secondary | ICD-10-CM | POA: Insufficient documentation

## 2020-05-15 DIAGNOSIS — K219 Gastro-esophageal reflux disease without esophagitis: Secondary | ICD-10-CM | POA: Insufficient documentation

## 2020-05-15 DIAGNOSIS — E876 Hypokalemia: Secondary | ICD-10-CM | POA: Insufficient documentation

## 2020-05-15 DIAGNOSIS — Z7901 Long term (current) use of anticoagulants: Secondary | ICD-10-CM | POA: Insufficient documentation

## 2020-05-15 DIAGNOSIS — R7301 Impaired fasting glucose: Secondary | ICD-10-CM | POA: Insufficient documentation

## 2020-05-16 ENCOUNTER — Other Ambulatory Visit: Payer: Self-pay | Admitting: Cardiology

## 2020-05-18 NOTE — Progress Notes (Signed)
Cardiology Office Note:    Date:  05/19/2020   ID:  Stephanie Wiggins, DOB 1936/09/19, MRN [id number]  PCP:  Street, Sharon Mt, MD  Cardiologist:  Shirlee More, MD    Referring MD: [address], Sharon Mt, *    ASSESSMENT:    1. Apical variant hypertrophic cardiomyopathy ([place])   2. Chronic atrial fibrillation (HCC)   3. Chronic anticoagulation   4. Hypertensive heart disease without heart failure    PLAN:    In order of problems listed above:  1. Stable asymptomatic continue current treatment including beta-blocker. 2. Rate controlled continue beta-blocker and current anticoagulant without bleeding complication 3. BP was markedly improved she is resistant continue her multidrug regimen I encouraged her to check blood pressure at home I will plan to see her in the office in 6 months 4. Stable hyperlipidemia continue her intermediate intensity statin.  Her most recent lipid profile 12/20/2019 at target cholesterol 133 LDL 72 HDL 40 triglycerides 106.   Next appointment: 6   Medication Adjustments/Labs and Tests Ordered: Current medicines are reviewed at length with the patient today.  Concerns regarding medicines are outlined above.  No orders of the defined types were placed in this encounter.  No orders of the defined types were placed in this encounter.   Chief Complaint  Patient presents with  . Follow-up  . Hypertension    History of Present Illness:    Stephanie Wiggins is a 84 y.o. female with a hx of hypertrophic cardiomyopathy apical variant permanent atrial fibrillation chronic anticoagulation and hypertension last seen 04/15/2020.  At her last visit her blood pressure was severely elevated 190/100 with a multidrug regimen for resistant hypertension. Compliance with diet, lifestyle and medications: Yes  Her blood pressure finally is at target and tolerates her multidrug regimen no edema shortness of breath chest pain palpitation or syncope.   Compliant medications but not checking her blood pressure regularly. Past Medical History:  Diagnosis Date  . Anticoagulant long-term use   . Anxiety 10/24/2017  . Anxiety   . Atrial fibrillation ([place])   . Chronic anticoagulation 10/27/2017  . Chronic atrial fibrillation ([place]) 07/15/2015   Overview:  CHADS2 vasc score= 4 Echo with mild LVH, normal EF% and mild MR, LA normal Holter with rate controlled AF  . Dyslipidemia   . Elevated fasting glucose   . Esophageal reflux 10/24/2017  . GERD (gastroesophageal reflux disease)   . Hyperlipidemia 10/24/2017  . Hypertension 10/24/2017  . Hypertensive heart disease 10/24/2017  . Hypokalemia   . Incarcerated umbilical hernia 2/33/0076  . Persistent atrial fibrillation ([employer]) 07/15/2015   Overview:  CHADS2 vasc score= 4 Echo with mild LVH, normal EF% and mild MR, LA normal Holter with rate controlled AF    Past Surgical History:  Procedure Laterality Date  . BREAST EXCISIONAL BIOPSY Right 40 yrs   benign  . CATARACT EXTRACTION    . DILATION AND CURETTAGE OF UTERUS      Current Medications: Current Meds  Medication Sig  . amLODipine (NORVASC) 5 MG tablet Take 1 tablet (5 mg total) by mouth daily.  Marland Kitchen apixaban (ELIQUIS) 5 MG TABS tablet Take 5 mg by mouth 2 (two) times daily.  . Calcium Carbonate-Vit D-Min (RA CALCIUM 600/VIT D/MINERALS) 600-200 MG-UNIT TABS Take 200 mg by mouth daily.  . carvedilol (COREG) 25 MG tablet Take 25 mg by mouth 2 (two) times daily with a meal. 1 in the am and 1 at night  . cetirizine (ZYRTEC) 10 MG tablet  Take 10 mg by mouth 2 (two) times daily.  . Cholecalciferol (VITAMIN D3) 75 MCG (3000 UT) TABS Take 1 tablet by mouth daily.  . cloNIDine (CATAPRES) 0.1 MG tablet TAKE 1 TABLET BY MOUTH ONCE (1) DAILY AT BEDTIME  . clorazepate (TRANXENE) 3.75 MG tablet Take 3.75 mg by mouth 2 (two) times daily as needed for anxiety.  Marland Kitchen ELIQUIS 5 MG TABS tablet Take 5 mg by mouth 2 (two) times daily.  Marland Kitchen escitalopram (LEXAPRO) 10 MG  tablet Take 0.5 mg by mouth daily.  . Ferrous Sulfate (IRON) 325 (65 Fe) MG TABS Take 1 tablet by mouth daily.  . fluticasone (FLONASE) 50 MCG/ACT nasal spray 1 spray by Each Nare route daily as needed.  . furosemide (LASIX) 40 MG tablet Take 40 mg by mouth.  . Multiple Vitamins-Minerals (PRESERVISION AREDS) CAPS Take 1 capsule by mouth daily.  Marland Kitchen omeprazole (PRILOSEC) 20 MG capsule Take 20 mg by mouth daily.  . potassium chloride (KLOR-CON) 10 MEQ tablet Take 20 mEq by mouth daily.  . simvastatin (ZOCOR) 40 MG tablet Take 40 mg by mouth at bedtime.  Marland Kitchen telmisartan (MICARDIS) 40 MG tablet TAKE 1 TABLET BY MOUTH ONCE (1) DAILY  . vitamin B-12 (CYANOCOBALAMIN) 100 MCG tablet Take 100 mcg by mouth daily.     Allergies:   Penicillins and Sulfa antibiotics   Social History   Socioeconomic History  . Marital status: Married    Spouse name: Not on file  . Number of children: Not on file  . Years of education: Not on file  . Highest education level: Not on file  Occupational History  . Not on file  Tobacco Use  . Smoking status: Never Smoker  . Smokeless tobacco: Never Used  Vaping Use  . Vaping Use: Never used  Substance and Sexual Activity  . Alcohol use: No  . Drug use: No  . Sexual activity: Not on file  Other Topics Concern  . Not on file  Social History Narrative  . Not on file   Social Determinants of Health   Financial Resource Strain:   . Difficulty of Paying Living Expenses: Not on file  Food Insecurity:   . Worried About Charity fundraiser in the Last Year: Not on file  . Ran Out of Food in the Last Year: Not on file  Transportation Needs:   . Lack of Transportation (Medical): Not on file  . Lack of Transportation (Non-Medical): Not on file  Physical Activity:   . Days of Exercise per Week: Not on file  . Minutes of Exercise per Session: Not on file  Stress:   . Feeling of Stress : Not on file  Social Connections:   . Frequency of Communication with Friends and  Family: Not on file  . Frequency of Social Gatherings with Friends and Family: Not on file  . Attends Religious Services: Not on file  . Active Member of Clubs or Organizations: Not on file  . Attends Archivist Meetings: Not on file  . Marital Status: Not on file     Family History: The patient's family history includes Diabetes in her brother; Heart attack in her father; Hypertension in her brother and sister; Stroke in her mother. ROS:   Please see the history of present illness.    All other systems reviewed and are negative.  EKGs/Labs/Other Studies Reviewed:    The following studies were reviewed today:  Recent Labs: 04/15/2020: BUN 16; Creatinine, Ser 0.92; Potassium 3.8;  Sodium 142  Recent Lipid Panel No results found for: CHOL, TRIG, HDL, CHOLHDL, VLDL, LDLCALC, LDLDIRECT  Physical Exam:    VS:  BP (!) 153/84   Pulse 72   Ht 5\' 4"  (1.626 m)   Wt 215 lb (97.5 kg)   SpO2 98%   BMI 36.90 kg/m     Wt Readings from Last 3 Encounters:  05/19/20 215 lb (97.5 kg)  04/15/20 210 lb 6.4 oz (95.4 kg)  01/15/20 205 lb (93 kg)    Repeat blood pressure by me 146/80 large cuff sitting right upper extremity GEN:  Well nourished, well developed in no acute distress HEENT: Normal NECK: No JVD; No carotid bruits LYMPHATICS: No lymphadenopathy CARDIAC: RRR, no murmurs, rubs, gallops RESPIRATORY:  Clear to auscultation without rales, wheezing or rhonchi  ABDOMEN: Soft, non-tender, non-distended MUSCULOSKELETAL:  No edema; No deformity  SKIN: Warm and dry NEUROLOGIC:  Alert and oriented x 3 PSYCHIATRIC:  Normal affect    Signed, Shirlee More, MD  05/19/2020 2:00 PM    [place]

## 2020-05-19 ENCOUNTER — Other Ambulatory Visit: Payer: Self-pay

## 2020-05-19 ENCOUNTER — Ambulatory Visit (INDEPENDENT_AMBULATORY_CARE_PROVIDER_SITE_OTHER): Payer: Medicare Other | Admitting: Cardiology

## 2020-05-19 ENCOUNTER — Encounter: Payer: Self-pay | Admitting: Cardiology

## 2020-05-19 VITALS — BP 153/84 | HR 72 | Ht 64.0 in | Wt 215.0 lb

## 2020-05-19 DIAGNOSIS — Z7901 Long term (current) use of anticoagulants: Secondary | ICD-10-CM | POA: Diagnosis not present

## 2020-05-19 DIAGNOSIS — I482 Chronic atrial fibrillation, unspecified: Secondary | ICD-10-CM | POA: Diagnosis not present

## 2020-05-19 DIAGNOSIS — I119 Hypertensive heart disease without heart failure: Secondary | ICD-10-CM | POA: Diagnosis not present

## 2020-05-19 DIAGNOSIS — I422 Other hypertrophic cardiomyopathy: Secondary | ICD-10-CM

## 2020-05-19 NOTE — Patient Instructions (Signed)

## 2020-08-29 DIAGNOSIS — Z23 Encounter for immunization: Secondary | ICD-10-CM | POA: Diagnosis not present

## 2020-09-11 ENCOUNTER — Other Ambulatory Visit: Payer: Self-pay | Admitting: Cardiology

## 2020-11-05 DIAGNOSIS — I4891 Unspecified atrial fibrillation: Secondary | ICD-10-CM | POA: Insufficient documentation

## 2020-11-16 NOTE — Progress Notes (Signed)
Cardiology Office Note:    Date:  11/17/2020   ID:  Stephanie Wiggins, DOB Sep 16, 1936, MRN [id number]  PCP:  Street, Sharon Mt, MD  Cardiologist:  Shirlee More, MD    Referring MD: Street, Sharon Mt, *she is having labs next week in your office please also do a proBNP level   ASSESSMENT:    1. Hypertensive heart disease without heart failure   2. Chronic atrial fibrillation (HCC)   3. Chronic anticoagulation   4. Apical variant hypertrophic cardiomyopathy ([place])    PLAN:    In order of problems listed above:  1. BP remains elevated, her edema is very characteristic of calcium channel blocker happened in the past will withdrawal amlodipine and increase her dose of centrally active clonidine.  She will continue her beta-blocker and diuretic along with ARB.  Due for labs next week in her PCP office renal function potassium 2. Rate controlled continue beta-blocker and anticoagulant 3. Plan echocardiogram in August a week before her office visit she will also need an EKG at that time   Next appointment: 6 months   Medication Adjustments/Labs and Tests Ordered: Current medicines are reviewed at length with the patient today.  Concerns regarding medicines are outlined above.  No orders of the defined types were placed in this encounter.  Meds ordered this encounter  Medications  . cloNIDine (CATAPRES) 0.1 MG tablet    Sig: Take 1 tablet (0.1 mg total) by mouth 2 (two) times daily.    Dispense:  180 tablet    Refill:  3    Chief Complaint  Patient presents with  . Follow-up    She has hypertrophic cardiomyopathy, nonobstructive, apical variant  . Atrial Fibrillation    History of Present Illness:    Stephanie Wiggins is a 85 y.o. female with a hx of apical variant nonobstructive hypertrophic cardiomyopathy with chronic atrial fibrillation anticoagulated and hypertensive heart disease last seen 05/19/2020. Compliance with diet, lifestyle and medications:  Yes  She is having peripheral edema and remembers that happened the past with amlodipine she is on it now. Despite edema no shortness of breath orthopnea chest pain palpitation or syncope. Unfortunately she does not follow her blood pressure at home She tolerates her anticoagulant without any bleeding complication She is having labs next week in her PCP office Past Medical History:  Diagnosis Date  . Anticoagulant long-term use   . Anxiety 10/24/2017  . Anxiety   . Atrial fibrillation ([place])   . Chronic anticoagulation 10/27/2017  . Chronic atrial fibrillation ([place]) 07/15/2015   Overview:  CHADS2 vasc score= 4 Echo with mild LVH, normal EF% and mild MR, LA normal Holter with rate controlled AF  . Dyslipidemia   . Elevated fasting glucose   . Esophageal reflux 10/24/2017  . GERD (gastroesophageal reflux disease)   . Hyperlipidemia 10/24/2017  . Hypertension 10/24/2017  . Hypertensive heart disease 10/24/2017  . Hypokalemia   . Incarcerated umbilical hernia 7/49/4496  . Persistent atrial fibrillation ([place]) 07/15/2015   Overview:  CHADS2 vasc score= 4 Echo with mild LVH, normal EF% and mild MR, LA normal Holter with rate controlled AF    Past Surgical History:  Procedure Laterality Date  . BREAST EXCISIONAL BIOPSY Right 40 yrs   benign  . CATARACT EXTRACTION    . DILATION AND CURETTAGE OF UTERUS      Current Medications: Current Meds  Medication Sig  . apixaban (ELIQUIS) 5 MG TABS tablet Take 5 mg by mouth 2 (two) times  daily.  . Calcium Carbonate-Vit D-Min (RA CALCIUM 600/VIT D/MINERALS) 600-200 MG-UNIT TABS Take 200 mg by mouth daily.  . carvedilol (COREG) 25 MG tablet Take 25 mg by mouth 2 (two) times daily with a meal. 1 in the am and 1 at night  . cetirizine (ZYRTEC) 10 MG tablet Take 10 mg by mouth 2 (two) times daily.  . Cholecalciferol (VITAMIN D3) 75 MCG (3000 UT) TABS Take 1 tablet by mouth daily.  . cloNIDine (CATAPRES) 0.1 MG tablet Take 1 tablet (0.1 mg total) by mouth  2 (two) times daily.  . clorazepate (TRANXENE) 3.75 MG tablet Take 3.75 mg by mouth 2 (two) times daily as needed for anxiety.  Marland Kitchen escitalopram (LEXAPRO) 10 MG tablet Take 0.5 mg by mouth daily.  . Ferrous Sulfate (IRON) 325 (65 Fe) MG TABS Take 1 tablet by mouth daily.  . fluticasone (FLONASE) 50 MCG/ACT nasal spray 1 spray by Each Nare route daily as needed.  . furosemide (LASIX) 40 MG tablet Take 40 mg by mouth.  . Multiple Vitamins-Minerals (PRESERVISION AREDS) CAPS Take 1 capsule by mouth daily.  Marland Kitchen omeprazole (PRILOSEC) 20 MG capsule Take 20 mg by mouth daily.  . simvastatin (ZOCOR) 40 MG tablet Take 40 mg by mouth at bedtime.  Marland Kitchen telmisartan (MICARDIS) 40 MG tablet TAKE 1 TABLET BY MOUTH ONCE (1) DAILY  . vitamin B-12 (CYANOCOBALAMIN) 100 MCG tablet Take 100 mcg by mouth daily.  . [DISCONTINUED] amLODipine (NORVASC) 5 MG tablet Take 1 tablet (5 mg total) by mouth daily.  . [DISCONTINUED] cloNIDine (CATAPRES) 0.1 MG tablet TAKE 1 TABLET BY MOUTH ONCE (1) DAILY AT BEDTIME     Allergies:   Penicillins and Sulfa antibiotics   Social History   Socioeconomic History  . Marital status: Married    Spouse name: Not on file  . Number of children: Not on file  . Years of education: Not on file  . Highest education level: Not on file  Occupational History  . Not on file  Tobacco Use  . Smoking status: Never Smoker  . Smokeless tobacco: Never Used  Vaping Use  . Vaping Use: Never used  Substance and Sexual Activity  . Alcohol use: No  . Drug use: No  . Sexual activity: Not on file  Other Topics Concern  . Not on file  Social History Narrative  . Not on file   Social Determinants of Health   Financial Resource Strain: Not on file  Food Insecurity: Not on file  Transportation Needs: Not on file  Physical Activity: Not on file  Stress: Not on file  Social Connections: Not on file     Family History: The patient's family history includes Diabetes in her brother; Heart attack in  her father; Hypertension in her brother and sister; Stroke in her mother. ROS:   Please see the history of present illness.    All other systems reviewed and are negative.  EKGs/Labs/Other Studies Reviewed:    The following studies were reviewed today:    Recent Labs: 04/15/2020: BUN 16; Creatinine, Ser 0.92; Potassium 3.8; Sodium 142  Recent Lipid Panel No results found for: CHOL, TRIG, HDL, CHOLHDL, VLDL, LDLCALC, LDLDIRECT  Physical Exam:    VS:  BP (!) 150/90   Pulse 72   Ht 5\' 4"  (1.626 m)   Wt 219 lb 12.8 oz (99.7 kg)   SpO2 98%   BMI 37.73 kg/m     Wt Readings from Last 3 Encounters:  11/17/20 219 lb 12.8  oz (99.7 kg)  05/19/20 215 lb (97.5 kg)  04/15/20 210 lb 6.4 oz (95.4 kg)     GEN:  Well nourished, well developed in no acute distress HEENT: Normal NECK: No JVD; No carotid bruits LYMPHATICS: No lymphadenopathy CARDIAC: Irregular S1 variable RRR, no murmurs, rubs, gallops RESPIRATORY:  Clear to auscultation without rales, wheezing or rhonchi  ABDOMEN: Soft, non-tender, non-distended MUSCULOSKELETAL: 2+ bilateral lower extremity pitting edema; No deformity  SKIN: Warm and dry NEUROLOGIC:  Alert and oriented x 3 PSYCHIATRIC:  Normal affect    Signed, Shirlee More, MD  11/17/2020 1:18 PM    [facility]

## 2020-11-17 ENCOUNTER — Other Ambulatory Visit: Payer: Self-pay

## 2020-11-17 ENCOUNTER — Encounter: Payer: Self-pay | Admitting: Cardiology

## 2020-11-17 ENCOUNTER — Ambulatory Visit (INDEPENDENT_AMBULATORY_CARE_PROVIDER_SITE_OTHER): Payer: Medicare Other | Admitting: Cardiology

## 2020-11-17 VITALS — BP 146/80 | HR 72 | Ht 64.0 in | Wt 219.8 lb

## 2020-11-17 DIAGNOSIS — I119 Hypertensive heart disease without heart failure: Secondary | ICD-10-CM

## 2020-11-17 DIAGNOSIS — I422 Other hypertrophic cardiomyopathy: Secondary | ICD-10-CM | POA: Diagnosis not present

## 2020-11-17 DIAGNOSIS — I482 Chronic atrial fibrillation, unspecified: Secondary | ICD-10-CM

## 2020-11-17 DIAGNOSIS — Z7901 Long term (current) use of anticoagulants: Secondary | ICD-10-CM

## 2020-11-17 MED ORDER — CLONIDINE HCL 0.1 MG PO TABS: 0.1000 mg | ORAL_TABLET | Freq: Two times a day (BID) | ORAL | 3 refills | Status: DC

## 2020-11-17 NOTE — Patient Instructions (Signed)
Medication Instructions:  Your physician has recommended you make the following change in your medication:  STOP: Amlodipine  INCREASE: Clonidine 0.1 mg take one tablet by mouth twice daily.  *If you need a refill on your cardiac medications before your next appointment, please call your pharmacy*   Lab Work: None If you have labs (blood work) drawn today and your tests are completely normal, you will receive your results only by: Marland Kitchen MyChart Message (if you have MyChart) OR . A paper copy in the mail If you have any lab test that is abnormal or we need to change your treatment, we will call you to review the results.   Testing/Procedures: Your physician has requested that you have an echocardiogram in August. Echocardiography is a painless test that uses sound waves to create images of your heart. It provides your doctor with information about the size and shape of your heart and how well your heart's chambers and valves are working. This procedure takes approximately one hour. There are no restrictions for this procedure.     Follow-Up: At [facility], you and your health needs are our priority.  As part of our continuing mission to provide you with exceptional heart care, we have created designated Provider Care Teams.  These Care Teams include your primary Cardiologist (physician) and Advanced Practice Providers (APPs -  Physician Assistants and Nurse Practitioners) who all work together to provide you with the care you need, when you need it.  We recommend signing up for the patient portal called "MyChart".  Sign up information is provided on this After Visit Summary.  MyChart is used to connect with patients for Virtual Visits (Telemedicine).  Patients are able to view lab/test results, encounter notes, upcoming appointments, etc.  Non-urgent messages can be sent to your provider as well.   To learn more about what you can do with MyChart, go to [email or web address].    Your next  appointment:   6 month(s) and after echo  The format for your next appointment:   In Person  Provider:   Shirlee More, MD   Other Instructions

## 2020-12-30 DIAGNOSIS — R7301 Impaired fasting glucose: Secondary | ICD-10-CM | POA: Diagnosis not present

## 2020-12-30 DIAGNOSIS — I10 Essential (primary) hypertension: Secondary | ICD-10-CM | POA: Diagnosis not present

## 2020-12-30 DIAGNOSIS — R0609 Other forms of dyspnea: Secondary | ICD-10-CM | POA: Diagnosis not present

## 2020-12-30 DIAGNOSIS — E876 Hypokalemia: Secondary | ICD-10-CM | POA: Diagnosis not present

## 2020-12-30 DIAGNOSIS — Z79899 Other long term (current) drug therapy: Secondary | ICD-10-CM | POA: Diagnosis not present

## 2020-12-30 DIAGNOSIS — I4891 Unspecified atrial fibrillation: Secondary | ICD-10-CM | POA: Diagnosis not present

## 2020-12-30 DIAGNOSIS — E785 Hyperlipidemia, unspecified: Secondary | ICD-10-CM | POA: Diagnosis not present

## 2020-12-30 DIAGNOSIS — Z0000 Encounter for general adult medical examination without abnormal findings: Secondary | ICD-10-CM | POA: Diagnosis not present

## 2021-01-09 ENCOUNTER — Other Ambulatory Visit: Payer: Self-pay | Admitting: Cardiology

## 2021-01-09 NOTE — Telephone Encounter (Signed)
Telmisartan approved and sent

## 2021-02-06 DIAGNOSIS — J060 Acute laryngopharyngitis: Secondary | ICD-10-CM | POA: Diagnosis not present

## 2021-04-28 ENCOUNTER — Other Ambulatory Visit: Payer: Medicare Other

## 2021-05-03 NOTE — Progress Notes (Signed)
Cardiology Office Note:    Date:  05/04/2021   ID:  Stephanie Wiggins, DOB 1936-09-18, MRN [id number]  PCP:  Street, Sharon Mt, MD  Cardiologist:  Shirlee More, MD    Referring MD: [address], Sharon Mt, *    ASSESSMENT:    1. Apical variant hypertrophic cardiomyopathy ([place])   2. Hypertensive heart disease without heart failure   3. Chronic atrial fibrillation ([place])   4. Chronic anticoagulation    PLAN:    In order of problems listed above:  Clinically she is doing well with her apical hypertrophic cardiomyopathy chest pain or syncope Continue her beta-blocker Stable BP at target is always difficult to balance the benefit and downside of medications.  These vegetables should continue the diuretic increased dose with edema along with essential active clonidine and an ARB out of necessity to keep blood pressure goals Rate controlled continue beta-blocker current anticoagulant  Next appointment: I will see her back in the office in 9 months   Medication Adjustments/Labs and Tests Ordered: Current medicines are reviewed at length with the patient today.  Concerns regarding medicines are outlined above.  Orders Placed This Encounter  Procedures   EKG 12-Lead    No orders of the defined types were placed in this encounter.   Chief Complaint  Patient presents with   Follow-up  Apical variant nonobstructive hypertrophic cardiomyopathy   History of Present Illness:    Stephanie Wiggins is a 85 y.o. female with a hx of apical variant nonobstructive hypertrophic cardiomyopathy chronic anticoagulation chronic atrial fibrillation hypertensive heart disease last seen 11/17/2020. Compliance with diet, lifestyle and medications: Yes  She is aware of a little bit more of edema and exertional shortness of breath than her previous visit No chest pain or syncope with hypertrophic cardiomyopathy. Past Medical History:  Diagnosis Date   Anticoagulant long-term use     Anxiety 10/24/2017   Anxiety    Atrial fibrillation (HCC)    Chronic anticoagulation 10/27/2017   Chronic atrial fibrillation ([place]) 07/15/2015   Overview:  CHADS2 vasc score= 4 Echo with mild LVH, normal EF% and mild MR, LA normal Holter with rate controlled AF   Dyslipidemia    Elevated fasting glucose    Esophageal reflux 10/24/2017   GERD (gastroesophageal reflux disease)    Hyperlipidemia 10/24/2017   Hypertension 10/24/2017   Hypertensive heart disease 10/24/2017   Hypokalemia    Incarcerated umbilical hernia 6/38/7564   Persistent atrial fibrillation ([place]) 07/15/2015   Overview:  CHADS2 vasc score= 4 Echo with mild LVH, normal EF% and mild MR, LA normal Holter with rate controlled AF    Past Surgical History:  Procedure Laterality Date   BREAST EXCISIONAL BIOPSY Right 40 yrs   benign   CATARACT EXTRACTION     DILATION AND CURETTAGE OF UTERUS      Current Medications: Current Meds  Medication Sig   apixaban (ELIQUIS) 5 MG TABS tablet Take 5 mg by mouth 2 (two) times daily.   Calcium Carbonate-Vit D-Min (RA CALCIUM 600/VIT D/MINERALS) 600-200 MG-UNIT TABS Take 200 mg by mouth daily.   carvedilol (COREG) 25 MG tablet Take 25 mg by mouth 2 (two) times daily with a meal. 1 in the am and 1 at night   cetirizine (ZYRTEC) 10 MG tablet Take 10 mg by mouth 2 (two) times daily.   Cholecalciferol (VITAMIN D3) 75 MCG (3000 UT) TABS Take 1 tablet by mouth daily.   cloNIDine (CATAPRES) 0.1 MG tablet Take 1 tablet (0.1 mg total)  by mouth 2 (two) times daily.   clorazepate (TRANXENE) 3.75 MG tablet Take 3.75 mg by mouth 2 (two) times daily as needed for anxiety.   escitalopram (LEXAPRO) 10 MG tablet Take 0.5 mg by mouth daily.   Ferrous Sulfate (IRON) 325 (65 Fe) MG TABS Take 1 tablet by mouth daily.   fluticasone (FLONASE) 50 MCG/ACT nasal spray Place 1 spray into both nostrils as needed for allergies.   furosemide (LASIX) 40 MG tablet Take 40 mg by mouth daily. Take an extra tablet in the  evening on Monday, Wednesday, and Friday.    Multiple Vitamins-Minerals (PRESERVISION AREDS) CAPS Take 1 capsule by mouth daily.   omeprazole (PRILOSEC) 20 MG capsule Take 20 mg by mouth daily.   potassium chloride (KLOR-CON) 10 MEQ tablet Take 20 mEq by mouth daily.   simvastatin (ZOCOR) 40 MG tablet Take 40 mg by mouth at bedtime.   telmisartan (MICARDIS) 40 MG tablet TAKE 1 TABLET BY MOUTH ONCE (1) DAILY   vitamin B-12 (CYANOCOBALAMIN) 100 MCG tablet Take 100 mcg by mouth daily.     Allergies:   Norvasc [amlodipine], Penicillins, and Sulfa antibiotics   Social History   Socioeconomic History   Marital status: Married    Spouse name: Not on file   Number of children: Not on file   Years of education: Not on file   Highest education level: Not on file  Occupational History   Not on file  Tobacco Use   Smoking status: Never   Smokeless tobacco: Never  Vaping Use   Vaping Use: Never used  Substance and Sexual Activity   Alcohol use: No   Drug use: No   Sexual activity: Not on file  Other Topics Concern   Not on file  Social History Narrative   Not on file   Social Determinants of Health   Financial Resource Strain: Not on file  Food Insecurity: Not on file  Transportation Needs: Not on file  Physical Activity: Not on file  Stress: Not on file  Social Connections: Not on file     Family History: The patient's family history includes Diabetes in her brother; Heart attack in her father; Hypertension in her brother and sister; Stroke in her mother. ROS:   Please see the history of present illness.    All other systems reviewed and are negative.  EKGs/Labs/Other Studies Reviewed:    The following studies were reviewed today:  EKG:  EKG ordered today and personally reviewed.  The ekg ordered today demonstrates LVH repolarization changes.  Typical of apical hypertrophic cardiomyopathy  Recent Labs: 12/30/2020 cholesterol 209 LDL 95 triglycerides 225 HDL 34 A1c 6.0%  hemoglobin 15.5 creatinine 0.9  Physical Exam:    VS:  BP 138/90 (BP Location: Right Arm, Patient Position: Sitting, Cuff Size: Normal)   Pulse 69   Ht 5\' 4"  (1.626 m)   Wt 221 lb (100.2 kg)   SpO2 98%   BMI 37.93 kg/m     Wt Readings from Last 3 Encounters:  05/04/21 221 lb (100.2 kg)  11/17/20 219 lb 12.8 oz (99.7 kg)  05/19/20 215 lb (97.5 kg)     GEN: Obese well nourished, well developed in no acute distress HEENT: Normal NECK: No JVD; No carotid bruits LYMPHATICS: No lymphadenopathy CARDIAC: Irregular rhythm variable" some  no murmurs, rubs, gallops RESPIRATORY:  Clear to auscultation without rales, wheezing or rhonchi  ABDOMEN: Soft, non-tender, non-distended MUSCULOSKELETAL: 1-2+ bilateral lower extremity pitting edema; No deformity  SKIN: Warm and  dry NEUROLOGIC:  Alert and oriented x 3 PSYCHIATRIC:  Normal affect    Signed, Shirlee More, MD  05/04/2021 2:26 PM    [place]

## 2021-05-04 ENCOUNTER — Ambulatory Visit (INDEPENDENT_AMBULATORY_CARE_PROVIDER_SITE_OTHER): Payer: Medicare Other | Admitting: Cardiology

## 2021-05-04 ENCOUNTER — Other Ambulatory Visit: Payer: Self-pay

## 2021-05-04 ENCOUNTER — Encounter: Payer: Self-pay | Admitting: Cardiology

## 2021-05-04 VITALS — BP 138/90 | HR 69 | Ht 64.0 in | Wt 221.0 lb

## 2021-05-04 DIAGNOSIS — I482 Chronic atrial fibrillation, unspecified: Secondary | ICD-10-CM

## 2021-05-04 DIAGNOSIS — I119 Hypertensive heart disease without heart failure: Secondary | ICD-10-CM

## 2021-05-04 DIAGNOSIS — I422 Other hypertrophic cardiomyopathy: Secondary | ICD-10-CM | POA: Diagnosis not present

## 2021-05-04 DIAGNOSIS — Z7901 Long term (current) use of anticoagulants: Secondary | ICD-10-CM | POA: Diagnosis not present

## 2021-05-04 NOTE — Patient Instructions (Signed)
Medication Instructions:  Your physician has recommended you make the following change in your medication:  INCREASE: Furosemide 40 mg take one tablet by mouth daily in the morning. Take another tablet in the evening on Monday, Wednesday, and Friday.  *If you need a refill on your cardiac medications before your next appointment, please call your pharmacy*   Lab Work: None If you have labs (blood work) drawn today and your tests are completely normal, you will receive your results only by: [place] (if you have MyChart) OR A paper copy in the mail If you have any lab test that is abnormal or we need to change your treatment, we will call you to review the results.   Testing/Procedures: None   Follow-Up: At [facility], you and your health needs are our priority.  As part of our continuing mission to provide you with exceptional heart care, we have created designated Provider Care Teams.  These Care Teams include your primary Cardiologist (physician) and Advanced Practice Providers (APPs -  Physician Assistants and Nurse Practitioners) who all work together to provide you with the care you need, when you need it.  We recommend signing up for the patient portal called "MyChart".  Sign up information is provided on this After Visit Summary.  MyChart is used to connect with patients for Virtual Visits (Telemedicine).  Patients are able to view lab/test results, encounter notes, upcoming appointments, etc.  Non-urgent messages can be sent to your provider as well.   To learn more about what you can do with MyChart, go to [email or web address].    Your next appointment:   9 month(s)  The format for your next appointment:   In Person  Provider:   Shirlee More, MD   Other Instructions

## 2021-07-28 DIAGNOSIS — Z6839 Body mass index (BMI) 39.0-39.9, adult: Secondary | ICD-10-CM | POA: Diagnosis not present

## 2021-07-28 DIAGNOSIS — K582 Mixed irritable bowel syndrome: Secondary | ICD-10-CM | POA: Diagnosis not present

## 2021-07-28 DIAGNOSIS — Z23 Encounter for immunization: Secondary | ICD-10-CM | POA: Diagnosis not present

## 2021-08-05 DIAGNOSIS — R0600 Dyspnea, unspecified: Secondary | ICD-10-CM | POA: Diagnosis not present

## 2021-08-05 DIAGNOSIS — I429 Cardiomyopathy, unspecified: Secondary | ICD-10-CM | POA: Diagnosis present

## 2021-08-05 DIAGNOSIS — Z4682 Encounter for fitting and adjustment of non-vascular catheter: Secondary | ICD-10-CM | POA: Diagnosis not present

## 2021-08-05 DIAGNOSIS — J9601 Acute respiratory failure with hypoxia: Secondary | ICD-10-CM | POA: Diagnosis not present

## 2021-08-05 DIAGNOSIS — N179 Acute kidney failure, unspecified: Secondary | ICD-10-CM | POA: Diagnosis present

## 2021-08-05 DIAGNOSIS — Z20822 Contact with and (suspected) exposure to covid-19: Secondary | ICD-10-CM | POA: Diagnosis not present

## 2021-08-05 DIAGNOSIS — Z736 Limitation of activities due to disability: Secondary | ICD-10-CM | POA: Diagnosis not present

## 2021-08-05 DIAGNOSIS — R112 Nausea with vomiting, unspecified: Secondary | ICD-10-CM | POA: Diagnosis not present

## 2021-08-05 DIAGNOSIS — M6281 Muscle weakness (generalized): Secondary | ICD-10-CM | POA: Diagnosis not present

## 2021-08-05 DIAGNOSIS — F419 Anxiety disorder, unspecified: Secondary | ICD-10-CM | POA: Diagnosis present

## 2021-08-05 DIAGNOSIS — K631 Perforation of intestine (nontraumatic): Secondary | ICD-10-CM | POA: Diagnosis not present

## 2021-08-05 DIAGNOSIS — K429 Umbilical hernia without obstruction or gangrene: Secondary | ICD-10-CM | POA: Diagnosis present

## 2021-08-05 DIAGNOSIS — K5651 Intestinal adhesions [bands], with partial obstruction: Secondary | ICD-10-CM | POA: Diagnosis not present

## 2021-08-05 DIAGNOSIS — A419 Sepsis, unspecified organism: Secondary | ICD-10-CM | POA: Diagnosis not present

## 2021-08-05 DIAGNOSIS — R1110 Vomiting, unspecified: Secondary | ICD-10-CM | POA: Diagnosis not present

## 2021-08-05 DIAGNOSIS — Z7902 Long term (current) use of antithrombotics/antiplatelets: Secondary | ICD-10-CM | POA: Diagnosis not present

## 2021-08-05 DIAGNOSIS — R262 Difficulty in walking, not elsewhere classified: Secondary | ICD-10-CM | POA: Diagnosis not present

## 2021-08-05 DIAGNOSIS — J90 Pleural effusion, not elsewhere classified: Secondary | ICD-10-CM | POA: Diagnosis not present

## 2021-08-05 DIAGNOSIS — Z79899 Other long term (current) drug therapy: Secondary | ICD-10-CM | POA: Diagnosis not present

## 2021-08-05 DIAGNOSIS — R6520 Severe sepsis without septic shock: Secondary | ICD-10-CM | POA: Diagnosis not present

## 2021-08-05 DIAGNOSIS — R110 Nausea: Secondary | ICD-10-CM | POA: Diagnosis not present

## 2021-08-05 DIAGNOSIS — N184 Chronic kidney disease, stage 4 (severe): Secondary | ICD-10-CM | POA: Diagnosis not present

## 2021-08-05 DIAGNOSIS — J189 Pneumonia, unspecified organism: Secondary | ICD-10-CM | POA: Diagnosis not present

## 2021-08-05 DIAGNOSIS — R531 Weakness: Secondary | ICD-10-CM | POA: Diagnosis not present

## 2021-08-05 DIAGNOSIS — K8020 Calculus of gallbladder without cholecystitis without obstruction: Secondary | ICD-10-CM | POA: Diagnosis not present

## 2021-08-05 DIAGNOSIS — Z932 Ileostomy status: Secondary | ICD-10-CM | POA: Diagnosis not present

## 2021-08-05 DIAGNOSIS — R509 Fever, unspecified: Secondary | ICD-10-CM | POA: Diagnosis not present

## 2021-08-05 DIAGNOSIS — Z888 Allergy status to other drugs, medicaments and biological substances status: Secondary | ICD-10-CM | POA: Diagnosis not present

## 2021-08-05 DIAGNOSIS — Z882 Allergy status to sulfonamides status: Secondary | ICD-10-CM | POA: Diagnosis not present

## 2021-08-05 DIAGNOSIS — R079 Chest pain, unspecified: Secondary | ICD-10-CM | POA: Diagnosis not present

## 2021-08-05 DIAGNOSIS — F331 Major depressive disorder, recurrent, moderate: Secondary | ICD-10-CM | POA: Diagnosis not present

## 2021-08-05 DIAGNOSIS — I129 Hypertensive chronic kidney disease with stage 1 through stage 4 chronic kidney disease, or unspecified chronic kidney disease: Secondary | ICD-10-CM | POA: Diagnosis not present

## 2021-08-05 DIAGNOSIS — I4891 Unspecified atrial fibrillation: Secondary | ICD-10-CM | POA: Diagnosis present

## 2021-08-05 DIAGNOSIS — F32A Depression, unspecified: Secondary | ICD-10-CM | POA: Diagnosis present

## 2021-08-05 DIAGNOSIS — Z4659 Encounter for fitting and adjustment of other gastrointestinal appliance and device: Secondary | ICD-10-CM | POA: Diagnosis not present

## 2021-08-05 DIAGNOSIS — E860 Dehydration: Secondary | ICD-10-CM | POA: Diagnosis present

## 2021-08-05 DIAGNOSIS — D72829 Elevated white blood cell count, unspecified: Secondary | ICD-10-CM | POA: Diagnosis not present

## 2021-08-05 DIAGNOSIS — Z48815 Encounter for surgical aftercare following surgery on the digestive system: Secondary | ICD-10-CM | POA: Diagnosis not present

## 2021-08-05 DIAGNOSIS — K6389 Other specified diseases of intestine: Secondary | ICD-10-CM | POA: Diagnosis present

## 2021-08-05 DIAGNOSIS — R1033 Periumbilical pain: Secondary | ICD-10-CM | POA: Diagnosis not present

## 2021-08-05 DIAGNOSIS — L24A2 Irritant contact dermatitis due to fecal, urinary or dual incontinence: Secondary | ICD-10-CM | POA: Diagnosis not present

## 2021-08-05 DIAGNOSIS — K529 Noninfective gastroenteritis and colitis, unspecified: Secondary | ICD-10-CM | POA: Diagnosis not present

## 2021-08-05 DIAGNOSIS — E669 Obesity, unspecified: Secondary | ICD-10-CM | POA: Diagnosis not present

## 2021-08-05 DIAGNOSIS — E870 Hyperosmolality and hypernatremia: Secondary | ICD-10-CM | POA: Diagnosis present

## 2021-08-05 DIAGNOSIS — D72819 Decreased white blood cell count, unspecified: Secondary | ICD-10-CM | POA: Diagnosis not present

## 2021-08-05 DIAGNOSIS — R159 Full incontinence of feces: Secondary | ICD-10-CM | POA: Diagnosis not present

## 2021-08-05 DIAGNOSIS — I10 Essential (primary) hypertension: Secondary | ICD-10-CM | POA: Diagnosis not present

## 2021-08-05 DIAGNOSIS — D122 Benign neoplasm of ascending colon: Secondary | ICD-10-CM | POA: Diagnosis not present

## 2021-08-05 DIAGNOSIS — E119 Type 2 diabetes mellitus without complications: Secondary | ICD-10-CM | POA: Diagnosis not present

## 2021-08-05 DIAGNOSIS — I509 Heart failure, unspecified: Secondary | ICD-10-CM | POA: Diagnosis present

## 2021-08-05 DIAGNOSIS — Z7401 Bed confinement status: Secondary | ICD-10-CM | POA: Diagnosis not present

## 2021-08-05 DIAGNOSIS — E8809 Other disorders of plasma-protein metabolism, not elsewhere classified: Secondary | ICD-10-CM | POA: Diagnosis present

## 2021-08-05 DIAGNOSIS — R0902 Hypoxemia: Secondary | ICD-10-CM | POA: Diagnosis not present

## 2021-08-05 DIAGNOSIS — I517 Cardiomegaly: Secondary | ICD-10-CM | POA: Diagnosis not present

## 2021-08-05 DIAGNOSIS — J9811 Atelectasis: Secondary | ICD-10-CM | POA: Diagnosis not present

## 2021-08-05 DIAGNOSIS — R0602 Shortness of breath: Secondary | ICD-10-CM | POA: Diagnosis not present

## 2021-08-05 DIAGNOSIS — K449 Diaphragmatic hernia without obstruction or gangrene: Secondary | ICD-10-CM | POA: Diagnosis not present

## 2021-08-05 DIAGNOSIS — K5939 Other megacolon: Secondary | ICD-10-CM | POA: Diagnosis not present

## 2021-08-05 DIAGNOSIS — K56691 Other complete intestinal obstruction: Secondary | ICD-10-CM | POA: Diagnosis not present

## 2021-08-05 DIAGNOSIS — L89611 Pressure ulcer of right heel, stage 1: Secondary | ICD-10-CM | POA: Diagnosis not present

## 2021-08-05 DIAGNOSIS — R1084 Generalized abdominal pain: Secondary | ICD-10-CM | POA: Diagnosis not present

## 2021-08-05 DIAGNOSIS — K668 Other specified disorders of peritoneum: Secondary | ICD-10-CM | POA: Diagnosis present

## 2021-08-05 DIAGNOSIS — R609 Edema, unspecified: Secondary | ICD-10-CM | POA: Diagnosis not present

## 2021-08-05 DIAGNOSIS — M1990 Unspecified osteoarthritis, unspecified site: Secondary | ICD-10-CM | POA: Diagnosis present

## 2021-08-05 DIAGNOSIS — R109 Unspecified abdominal pain: Secondary | ICD-10-CM | POA: Diagnosis not present

## 2021-08-05 DIAGNOSIS — Z6839 Body mass index (BMI) 39.0-39.9, adult: Secondary | ICD-10-CM | POA: Diagnosis not present

## 2021-08-05 DIAGNOSIS — G9341 Metabolic encephalopathy: Secondary | ICD-10-CM | POA: Diagnosis not present

## 2021-08-05 DIAGNOSIS — I482 Chronic atrial fibrillation, unspecified: Secondary | ICD-10-CM | POA: Diagnosis not present

## 2021-08-05 DIAGNOSIS — K639 Disease of intestine, unspecified: Secondary | ICD-10-CM | POA: Diagnosis not present

## 2021-08-05 DIAGNOSIS — L89810 Pressure ulcer of head, unstageable: Secondary | ICD-10-CM | POA: Diagnosis not present

## 2021-08-05 DIAGNOSIS — I110 Hypertensive heart disease with heart failure: Secondary | ICD-10-CM | POA: Diagnosis present

## 2021-08-06 DIAGNOSIS — I4891 Unspecified atrial fibrillation: Secondary | ICD-10-CM

## 2021-08-07 ENCOUNTER — Telehealth: Payer: Self-pay | Admitting: Cardiology

## 2021-08-07 NOTE — Telephone Encounter (Signed)
Dr. Jimmye Norman with Anesthesia at [facility] is requesting a copy of patient's echo from 01/16/20.  Fax#: [phone] (attnSharyn Lull)

## 2021-08-07 NOTE — Telephone Encounter (Signed)
Echo sent via Epic

## 2021-08-27 DIAGNOSIS — F419 Anxiety disorder, unspecified: Secondary | ICD-10-CM | POA: Diagnosis not present

## 2021-08-27 DIAGNOSIS — L89611 Pressure ulcer of right heel, stage 1: Secondary | ICD-10-CM | POA: Diagnosis not present

## 2021-08-27 DIAGNOSIS — R42 Dizziness and giddiness: Secondary | ICD-10-CM | POA: Diagnosis not present

## 2021-08-27 DIAGNOSIS — R262 Difficulty in walking, not elsewhere classified: Secondary | ICD-10-CM | POA: Diagnosis not present

## 2021-08-27 DIAGNOSIS — F410 Panic disorder [episodic paroxysmal anxiety] without agoraphobia: Secondary | ICD-10-CM | POA: Diagnosis not present

## 2021-08-27 DIAGNOSIS — Z888 Allergy status to other drugs, medicaments and biological substances status: Secondary | ICD-10-CM | POA: Diagnosis not present

## 2021-08-27 DIAGNOSIS — R519 Headache, unspecified: Secondary | ICD-10-CM | POA: Diagnosis not present

## 2021-08-27 DIAGNOSIS — Z932 Ileostomy status: Secondary | ICD-10-CM | POA: Diagnosis not present

## 2021-08-27 DIAGNOSIS — I129 Hypertensive chronic kidney disease with stage 1 through stage 4 chronic kidney disease, or unspecified chronic kidney disease: Secondary | ICD-10-CM | POA: Diagnosis not present

## 2021-08-27 DIAGNOSIS — I4821 Permanent atrial fibrillation: Secondary | ICD-10-CM | POA: Diagnosis not present

## 2021-08-27 DIAGNOSIS — B349 Viral infection, unspecified: Secondary | ICD-10-CM | POA: Diagnosis not present

## 2021-08-27 DIAGNOSIS — R159 Full incontinence of feces: Secondary | ICD-10-CM | POA: Diagnosis not present

## 2021-08-27 DIAGNOSIS — R0989 Other specified symptoms and signs involving the circulatory and respiratory systems: Secondary | ICD-10-CM | POA: Diagnosis not present

## 2021-08-27 DIAGNOSIS — N184 Chronic kidney disease, stage 4 (severe): Secondary | ICD-10-CM | POA: Diagnosis not present

## 2021-08-27 DIAGNOSIS — R5381 Other malaise: Secondary | ICD-10-CM | POA: Diagnosis not present

## 2021-08-27 DIAGNOSIS — Z736 Limitation of activities due to disability: Secondary | ICD-10-CM | POA: Diagnosis not present

## 2021-08-27 DIAGNOSIS — J9600 Acute respiratory failure, unspecified whether with hypoxia or hypercapnia: Secondary | ICD-10-CM | POA: Diagnosis not present

## 2021-08-27 DIAGNOSIS — L24A2 Irritant contact dermatitis due to fecal, urinary or dual incontinence: Secondary | ICD-10-CM | POA: Diagnosis not present

## 2021-08-27 DIAGNOSIS — L89810 Pressure ulcer of head, unstageable: Secondary | ICD-10-CM | POA: Diagnosis not present

## 2021-08-27 DIAGNOSIS — R2689 Other abnormalities of gait and mobility: Secondary | ICD-10-CM | POA: Diagnosis not present

## 2021-08-27 DIAGNOSIS — E876 Hypokalemia: Secondary | ICD-10-CM | POA: Diagnosis not present

## 2021-08-27 DIAGNOSIS — G9341 Metabolic encephalopathy: Secondary | ICD-10-CM | POA: Diagnosis not present

## 2021-08-27 DIAGNOSIS — R112 Nausea with vomiting, unspecified: Secondary | ICD-10-CM | POA: Diagnosis not present

## 2021-08-27 DIAGNOSIS — R5383 Other fatigue: Secondary | ICD-10-CM | POA: Diagnosis not present

## 2021-08-27 DIAGNOSIS — E669 Obesity, unspecified: Secondary | ICD-10-CM | POA: Diagnosis not present

## 2021-08-27 DIAGNOSIS — R109 Unspecified abdominal pain: Secondary | ICD-10-CM | POA: Diagnosis not present

## 2021-08-27 DIAGNOSIS — R630 Anorexia: Secondary | ICD-10-CM | POA: Diagnosis not present

## 2021-08-27 DIAGNOSIS — Z6839 Body mass index (BMI) 39.0-39.9, adult: Secondary | ICD-10-CM | POA: Diagnosis not present

## 2021-08-27 DIAGNOSIS — G4720 Circadian rhythm sleep disorder, unspecified type: Secondary | ICD-10-CM | POA: Diagnosis not present

## 2021-08-27 DIAGNOSIS — R531 Weakness: Secondary | ICD-10-CM | POA: Diagnosis not present

## 2021-08-27 DIAGNOSIS — Z48815 Encounter for surgical aftercare following surgery on the digestive system: Secondary | ICD-10-CM | POA: Diagnosis not present

## 2021-08-27 DIAGNOSIS — Z432 Encounter for attention to ileostomy: Secondary | ICD-10-CM | POA: Diagnosis not present

## 2021-08-27 DIAGNOSIS — Z7401 Bed confinement status: Secondary | ICD-10-CM | POA: Diagnosis not present

## 2021-08-27 DIAGNOSIS — D490 Neoplasm of unspecified behavior of digestive system: Secondary | ICD-10-CM | POA: Diagnosis not present

## 2021-08-27 DIAGNOSIS — Z7901 Long term (current) use of anticoagulants: Secondary | ICD-10-CM | POA: Diagnosis not present

## 2021-08-27 DIAGNOSIS — F331 Major depressive disorder, recurrent, moderate: Secondary | ICD-10-CM | POA: Diagnosis not present

## 2021-08-27 DIAGNOSIS — I10 Essential (primary) hypertension: Secondary | ICD-10-CM | POA: Diagnosis not present

## 2021-08-27 DIAGNOSIS — Z20822 Contact with and (suspected) exposure to covid-19: Secondary | ICD-10-CM | POA: Diagnosis not present

## 2021-08-27 DIAGNOSIS — M6281 Muscle weakness (generalized): Secondary | ICD-10-CM | POA: Diagnosis not present

## 2021-08-27 DIAGNOSIS — Z882 Allergy status to sulfonamides status: Secondary | ICD-10-CM | POA: Diagnosis not present

## 2021-08-27 DIAGNOSIS — Z6836 Body mass index (BMI) 36.0-36.9, adult: Secondary | ICD-10-CM | POA: Diagnosis not present

## 2021-08-27 DIAGNOSIS — K639 Disease of intestine, unspecified: Secondary | ICD-10-CM | POA: Diagnosis not present

## 2021-08-27 DIAGNOSIS — N179 Acute kidney failure, unspecified: Secondary | ICD-10-CM | POA: Diagnosis not present

## 2021-08-27 DIAGNOSIS — J9621 Acute and chronic respiratory failure with hypoxia: Secondary | ICD-10-CM | POA: Diagnosis not present

## 2021-08-27 DIAGNOSIS — I482 Chronic atrial fibrillation, unspecified: Secondary | ICD-10-CM | POA: Diagnosis not present

## 2021-08-27 DIAGNOSIS — Z743 Need for continuous supervision: Secondary | ICD-10-CM | POA: Diagnosis not present

## 2021-08-27 DIAGNOSIS — Z8701 Personal history of pneumonia (recurrent): Secondary | ICD-10-CM | POA: Diagnosis not present

## 2021-08-27 DIAGNOSIS — K632 Fistula of intestine: Secondary | ICD-10-CM | POA: Diagnosis not present

## 2021-09-10 DIAGNOSIS — R630 Anorexia: Secondary | ICD-10-CM | POA: Diagnosis not present

## 2021-09-10 DIAGNOSIS — R42 Dizziness and giddiness: Secondary | ICD-10-CM | POA: Diagnosis not present

## 2021-09-10 DIAGNOSIS — G4720 Circadian rhythm sleep disorder, unspecified type: Secondary | ICD-10-CM | POA: Diagnosis not present

## 2021-09-10 DIAGNOSIS — K6389 Other specified diseases of intestine: Secondary | ICD-10-CM | POA: Diagnosis not present

## 2021-09-10 DIAGNOSIS — Z6836 Body mass index (BMI) 36.0-36.9, adult: Secondary | ICD-10-CM | POA: Diagnosis not present

## 2021-09-10 DIAGNOSIS — R0989 Other specified symptoms and signs involving the circulatory and respiratory systems: Secondary | ICD-10-CM | POA: Diagnosis not present

## 2021-09-10 DIAGNOSIS — B349 Viral infection, unspecified: Secondary | ICD-10-CM | POA: Diagnosis not present

## 2021-09-10 DIAGNOSIS — R5383 Other fatigue: Secondary | ICD-10-CM | POA: Diagnosis not present

## 2021-09-10 DIAGNOSIS — Z8701 Personal history of pneumonia (recurrent): Secondary | ICD-10-CM | POA: Diagnosis not present

## 2021-09-10 DIAGNOSIS — K632 Fistula of intestine: Secondary | ICD-10-CM | POA: Diagnosis not present

## 2021-09-10 DIAGNOSIS — Z9049 Acquired absence of other specified parts of digestive tract: Secondary | ICD-10-CM | POA: Diagnosis not present

## 2021-09-10 DIAGNOSIS — Z48815 Encounter for surgical aftercare following surgery on the digestive system: Secondary | ICD-10-CM | POA: Diagnosis not present

## 2021-09-10 DIAGNOSIS — F419 Anxiety disorder, unspecified: Secondary | ICD-10-CM | POA: Diagnosis not present

## 2021-09-10 DIAGNOSIS — R531 Weakness: Secondary | ICD-10-CM | POA: Diagnosis not present

## 2021-09-10 DIAGNOSIS — I10 Essential (primary) hypertension: Secondary | ICD-10-CM | POA: Diagnosis not present

## 2021-09-10 DIAGNOSIS — G9341 Metabolic encephalopathy: Secondary | ICD-10-CM | POA: Diagnosis not present

## 2021-09-10 DIAGNOSIS — D490 Neoplasm of unspecified behavior of digestive system: Secondary | ICD-10-CM | POA: Diagnosis not present

## 2021-09-10 DIAGNOSIS — I129 Hypertensive chronic kidney disease with stage 1 through stage 4 chronic kidney disease, or unspecified chronic kidney disease: Secondary | ICD-10-CM | POA: Diagnosis not present

## 2021-09-10 DIAGNOSIS — Z20822 Contact with and (suspected) exposure to covid-19: Secondary | ICD-10-CM | POA: Diagnosis not present

## 2021-09-10 DIAGNOSIS — I4891 Unspecified atrial fibrillation: Secondary | ICD-10-CM | POA: Diagnosis not present

## 2021-09-10 DIAGNOSIS — Z7901 Long term (current) use of anticoagulants: Secondary | ICD-10-CM | POA: Diagnosis not present

## 2021-09-10 DIAGNOSIS — I482 Chronic atrial fibrillation, unspecified: Secondary | ICD-10-CM | POA: Diagnosis not present

## 2021-09-10 DIAGNOSIS — R519 Headache, unspecified: Secondary | ICD-10-CM | POA: Diagnosis not present

## 2021-09-10 DIAGNOSIS — R112 Nausea with vomiting, unspecified: Secondary | ICD-10-CM | POA: Diagnosis not present

## 2021-09-10 DIAGNOSIS — N184 Chronic kidney disease, stage 4 (severe): Secondary | ICD-10-CM | POA: Diagnosis not present

## 2021-09-10 DIAGNOSIS — E876 Hypokalemia: Secondary | ICD-10-CM | POA: Diagnosis not present

## 2021-09-10 DIAGNOSIS — J189 Pneumonia, unspecified organism: Secondary | ICD-10-CM | POA: Diagnosis not present

## 2021-09-10 DIAGNOSIS — Z432 Encounter for attention to ileostomy: Secondary | ICD-10-CM | POA: Diagnosis not present

## 2021-09-10 DIAGNOSIS — R2689 Other abnormalities of gait and mobility: Secondary | ICD-10-CM | POA: Diagnosis not present

## 2021-09-10 DIAGNOSIS — Z7409 Other reduced mobility: Secondary | ICD-10-CM | POA: Diagnosis not present

## 2021-09-11 DIAGNOSIS — G9341 Metabolic encephalopathy: Secondary | ICD-10-CM | POA: Diagnosis not present

## 2021-09-11 DIAGNOSIS — I4891 Unspecified atrial fibrillation: Secondary | ICD-10-CM | POA: Diagnosis not present

## 2021-09-11 DIAGNOSIS — Z9049 Acquired absence of other specified parts of digestive tract: Secondary | ICD-10-CM | POA: Diagnosis not present

## 2021-09-11 DIAGNOSIS — I10 Essential (primary) hypertension: Secondary | ICD-10-CM | POA: Diagnosis not present

## 2021-09-11 DIAGNOSIS — K6389 Other specified diseases of intestine: Secondary | ICD-10-CM | POA: Diagnosis not present

## 2021-09-11 DIAGNOSIS — J189 Pneumonia, unspecified organism: Secondary | ICD-10-CM | POA: Diagnosis not present

## 2021-09-11 DIAGNOSIS — Z7409 Other reduced mobility: Secondary | ICD-10-CM | POA: Diagnosis not present

## 2021-09-13 DIAGNOSIS — G9341 Metabolic encephalopathy: Secondary | ICD-10-CM | POA: Diagnosis not present

## 2021-09-13 DIAGNOSIS — R2689 Other abnormalities of gait and mobility: Secondary | ICD-10-CM | POA: Diagnosis not present

## 2021-09-13 DIAGNOSIS — I4891 Unspecified atrial fibrillation: Secondary | ICD-10-CM | POA: Diagnosis not present

## 2021-09-13 DIAGNOSIS — I10 Essential (primary) hypertension: Secondary | ICD-10-CM | POA: Diagnosis not present

## 2021-09-14 DIAGNOSIS — G9341 Metabolic encephalopathy: Secondary | ICD-10-CM | POA: Diagnosis not present

## 2021-09-14 DIAGNOSIS — I4891 Unspecified atrial fibrillation: Secondary | ICD-10-CM | POA: Diagnosis not present

## 2021-09-14 DIAGNOSIS — I10 Essential (primary) hypertension: Secondary | ICD-10-CM | POA: Diagnosis not present

## 2021-09-14 DIAGNOSIS — Z9049 Acquired absence of other specified parts of digestive tract: Secondary | ICD-10-CM | POA: Diagnosis not present

## 2021-09-14 DIAGNOSIS — Z7409 Other reduced mobility: Secondary | ICD-10-CM | POA: Diagnosis not present

## 2021-09-15 DIAGNOSIS — I10 Essential (primary) hypertension: Secondary | ICD-10-CM | POA: Diagnosis not present

## 2021-09-15 DIAGNOSIS — K6389 Other specified diseases of intestine: Secondary | ICD-10-CM | POA: Diagnosis not present

## 2021-09-15 DIAGNOSIS — J189 Pneumonia, unspecified organism: Secondary | ICD-10-CM | POA: Diagnosis not present

## 2021-09-15 DIAGNOSIS — G9341 Metabolic encephalopathy: Secondary | ICD-10-CM | POA: Diagnosis not present

## 2021-09-15 DIAGNOSIS — Z9049 Acquired absence of other specified parts of digestive tract: Secondary | ICD-10-CM | POA: Diagnosis not present

## 2021-09-15 DIAGNOSIS — Z7409 Other reduced mobility: Secondary | ICD-10-CM | POA: Diagnosis not present

## 2021-09-15 DIAGNOSIS — I4891 Unspecified atrial fibrillation: Secondary | ICD-10-CM | POA: Diagnosis not present

## 2021-09-16 DIAGNOSIS — Z9049 Acquired absence of other specified parts of digestive tract: Secondary | ICD-10-CM | POA: Diagnosis not present

## 2021-09-16 DIAGNOSIS — I4891 Unspecified atrial fibrillation: Secondary | ICD-10-CM | POA: Diagnosis not present

## 2021-09-16 DIAGNOSIS — G9341 Metabolic encephalopathy: Secondary | ICD-10-CM | POA: Diagnosis not present

## 2021-09-16 DIAGNOSIS — I10 Essential (primary) hypertension: Secondary | ICD-10-CM | POA: Diagnosis not present

## 2021-09-16 DIAGNOSIS — Z7409 Other reduced mobility: Secondary | ICD-10-CM | POA: Diagnosis not present

## 2021-09-17 DIAGNOSIS — G9341 Metabolic encephalopathy: Secondary | ICD-10-CM | POA: Diagnosis not present

## 2021-09-17 DIAGNOSIS — Z9049 Acquired absence of other specified parts of digestive tract: Secondary | ICD-10-CM | POA: Diagnosis not present

## 2021-09-17 DIAGNOSIS — Z7409 Other reduced mobility: Secondary | ICD-10-CM | POA: Diagnosis not present

## 2021-09-17 DIAGNOSIS — I10 Essential (primary) hypertension: Secondary | ICD-10-CM | POA: Diagnosis not present

## 2021-09-17 DIAGNOSIS — I4891 Unspecified atrial fibrillation: Secondary | ICD-10-CM | POA: Diagnosis not present

## 2021-09-18 DIAGNOSIS — I4891 Unspecified atrial fibrillation: Secondary | ICD-10-CM | POA: Diagnosis not present

## 2021-09-18 DIAGNOSIS — G9341 Metabolic encephalopathy: Secondary | ICD-10-CM | POA: Diagnosis not present

## 2021-09-18 DIAGNOSIS — I10 Essential (primary) hypertension: Secondary | ICD-10-CM | POA: Diagnosis not present

## 2021-09-18 DIAGNOSIS — Z9049 Acquired absence of other specified parts of digestive tract: Secondary | ICD-10-CM | POA: Diagnosis not present

## 2021-09-18 DIAGNOSIS — Z7409 Other reduced mobility: Secondary | ICD-10-CM | POA: Diagnosis not present

## 2021-09-19 DIAGNOSIS — G9341 Metabolic encephalopathy: Secondary | ICD-10-CM | POA: Diagnosis not present

## 2021-09-19 DIAGNOSIS — I10 Essential (primary) hypertension: Secondary | ICD-10-CM | POA: Diagnosis not present

## 2021-09-19 DIAGNOSIS — I4891 Unspecified atrial fibrillation: Secondary | ICD-10-CM | POA: Diagnosis not present

## 2021-09-19 DIAGNOSIS — Z9049 Acquired absence of other specified parts of digestive tract: Secondary | ICD-10-CM | POA: Diagnosis not present

## 2021-09-19 DIAGNOSIS — Z7409 Other reduced mobility: Secondary | ICD-10-CM | POA: Diagnosis not present

## 2021-09-20 DIAGNOSIS — I10 Essential (primary) hypertension: Secondary | ICD-10-CM | POA: Diagnosis not present

## 2021-09-20 DIAGNOSIS — Z7409 Other reduced mobility: Secondary | ICD-10-CM | POA: Diagnosis not present

## 2021-09-20 DIAGNOSIS — Z9049 Acquired absence of other specified parts of digestive tract: Secondary | ICD-10-CM | POA: Diagnosis not present

## 2021-09-20 DIAGNOSIS — G9341 Metabolic encephalopathy: Secondary | ICD-10-CM | POA: Diagnosis not present

## 2021-09-20 DIAGNOSIS — I4891 Unspecified atrial fibrillation: Secondary | ICD-10-CM | POA: Diagnosis not present

## 2021-09-21 DIAGNOSIS — Z7409 Other reduced mobility: Secondary | ICD-10-CM | POA: Diagnosis not present

## 2021-09-21 DIAGNOSIS — Z9049 Acquired absence of other specified parts of digestive tract: Secondary | ICD-10-CM | POA: Diagnosis not present

## 2021-09-21 DIAGNOSIS — G9341 Metabolic encephalopathy: Secondary | ICD-10-CM | POA: Diagnosis not present

## 2021-09-21 DIAGNOSIS — I10 Essential (primary) hypertension: Secondary | ICD-10-CM | POA: Diagnosis not present

## 2021-09-21 DIAGNOSIS — I4891 Unspecified atrial fibrillation: Secondary | ICD-10-CM | POA: Diagnosis not present

## 2021-09-22 DIAGNOSIS — I4891 Unspecified atrial fibrillation: Secondary | ICD-10-CM | POA: Diagnosis not present

## 2021-09-22 DIAGNOSIS — I10 Essential (primary) hypertension: Secondary | ICD-10-CM | POA: Diagnosis not present

## 2021-09-22 DIAGNOSIS — G9341 Metabolic encephalopathy: Secondary | ICD-10-CM | POA: Diagnosis not present

## 2021-09-22 DIAGNOSIS — Z9049 Acquired absence of other specified parts of digestive tract: Secondary | ICD-10-CM | POA: Diagnosis not present

## 2021-09-22 DIAGNOSIS — Z7409 Other reduced mobility: Secondary | ICD-10-CM | POA: Diagnosis not present

## 2021-09-23 DIAGNOSIS — Z7409 Other reduced mobility: Secondary | ICD-10-CM | POA: Diagnosis not present

## 2021-09-23 DIAGNOSIS — I4891 Unspecified atrial fibrillation: Secondary | ICD-10-CM | POA: Diagnosis not present

## 2021-09-23 DIAGNOSIS — I10 Essential (primary) hypertension: Secondary | ICD-10-CM | POA: Diagnosis not present

## 2021-09-23 DIAGNOSIS — Z9049 Acquired absence of other specified parts of digestive tract: Secondary | ICD-10-CM | POA: Diagnosis not present

## 2021-09-23 DIAGNOSIS — G9341 Metabolic encephalopathy: Secondary | ICD-10-CM | POA: Diagnosis not present

## 2021-09-24 DIAGNOSIS — Z9049 Acquired absence of other specified parts of digestive tract: Secondary | ICD-10-CM | POA: Diagnosis not present

## 2021-09-24 DIAGNOSIS — I4891 Unspecified atrial fibrillation: Secondary | ICD-10-CM | POA: Diagnosis not present

## 2021-09-24 DIAGNOSIS — Z7409 Other reduced mobility: Secondary | ICD-10-CM | POA: Diagnosis not present

## 2021-09-24 DIAGNOSIS — I10 Essential (primary) hypertension: Secondary | ICD-10-CM | POA: Diagnosis not present

## 2021-09-24 DIAGNOSIS — G9341 Metabolic encephalopathy: Secondary | ICD-10-CM | POA: Diagnosis not present

## 2021-09-25 DIAGNOSIS — I4891 Unspecified atrial fibrillation: Secondary | ICD-10-CM | POA: Diagnosis not present

## 2021-09-25 DIAGNOSIS — I10 Essential (primary) hypertension: Secondary | ICD-10-CM | POA: Diagnosis not present

## 2021-09-25 DIAGNOSIS — Z9049 Acquired absence of other specified parts of digestive tract: Secondary | ICD-10-CM | POA: Diagnosis not present

## 2021-09-25 DIAGNOSIS — G9341 Metabolic encephalopathy: Secondary | ICD-10-CM | POA: Diagnosis not present

## 2021-09-25 DIAGNOSIS — Z7409 Other reduced mobility: Secondary | ICD-10-CM | POA: Diagnosis not present

## 2021-10-01 DIAGNOSIS — I4891 Unspecified atrial fibrillation: Secondary | ICD-10-CM | POA: Diagnosis not present

## 2021-10-01 DIAGNOSIS — Z7409 Other reduced mobility: Secondary | ICD-10-CM | POA: Diagnosis not present

## 2021-10-01 DIAGNOSIS — G9341 Metabolic encephalopathy: Secondary | ICD-10-CM | POA: Diagnosis not present

## 2021-10-01 DIAGNOSIS — Z9049 Acquired absence of other specified parts of digestive tract: Secondary | ICD-10-CM | POA: Diagnosis not present

## 2021-10-01 DIAGNOSIS — I10 Essential (primary) hypertension: Secondary | ICD-10-CM | POA: Diagnosis not present

## 2021-10-02 DIAGNOSIS — Z7409 Other reduced mobility: Secondary | ICD-10-CM | POA: Diagnosis not present

## 2021-10-02 DIAGNOSIS — I4891 Unspecified atrial fibrillation: Secondary | ICD-10-CM | POA: Diagnosis not present

## 2021-10-02 DIAGNOSIS — G9341 Metabolic encephalopathy: Secondary | ICD-10-CM | POA: Diagnosis not present

## 2021-10-02 DIAGNOSIS — Z9049 Acquired absence of other specified parts of digestive tract: Secondary | ICD-10-CM | POA: Diagnosis not present

## 2021-10-02 DIAGNOSIS — I10 Essential (primary) hypertension: Secondary | ICD-10-CM | POA: Diagnosis not present

## 2021-10-05 DIAGNOSIS — E876 Hypokalemia: Secondary | ICD-10-CM | POA: Diagnosis not present

## 2021-10-05 DIAGNOSIS — I482 Chronic atrial fibrillation, unspecified: Secondary | ICD-10-CM | POA: Diagnosis not present

## 2021-10-05 DIAGNOSIS — G9341 Metabolic encephalopathy: Secondary | ICD-10-CM | POA: Diagnosis not present

## 2021-10-05 DIAGNOSIS — Z7951 Long term (current) use of inhaled steroids: Secondary | ICD-10-CM | POA: Diagnosis not present

## 2021-10-05 DIAGNOSIS — R6521 Severe sepsis with septic shock: Secondary | ICD-10-CM | POA: Diagnosis not present

## 2021-10-05 DIAGNOSIS — Z48815 Encounter for surgical aftercare following surgery on the digestive system: Secondary | ICD-10-CM | POA: Diagnosis not present

## 2021-10-05 DIAGNOSIS — J189 Pneumonia, unspecified organism: Secondary | ICD-10-CM | POA: Diagnosis not present

## 2021-10-05 DIAGNOSIS — G479 Sleep disorder, unspecified: Secondary | ICD-10-CM | POA: Diagnosis not present

## 2021-10-05 DIAGNOSIS — I129 Hypertensive chronic kidney disease with stage 1 through stage 4 chronic kidney disease, or unspecified chronic kidney disease: Secondary | ICD-10-CM | POA: Diagnosis not present

## 2021-10-05 DIAGNOSIS — F419 Anxiety disorder, unspecified: Secondary | ICD-10-CM | POA: Diagnosis not present

## 2021-10-05 DIAGNOSIS — K632 Fistula of intestine: Secondary | ICD-10-CM | POA: Diagnosis not present

## 2021-10-05 DIAGNOSIS — N184 Chronic kidney disease, stage 4 (severe): Secondary | ICD-10-CM | POA: Diagnosis not present

## 2021-10-05 DIAGNOSIS — A419 Sepsis, unspecified organism: Secondary | ICD-10-CM | POA: Diagnosis not present

## 2021-10-05 DIAGNOSIS — Z9181 History of falling: Secondary | ICD-10-CM | POA: Diagnosis not present

## 2021-10-05 DIAGNOSIS — N179 Acute kidney failure, unspecified: Secondary | ICD-10-CM | POA: Diagnosis not present

## 2021-10-05 DIAGNOSIS — Z9049 Acquired absence of other specified parts of digestive tract: Secondary | ICD-10-CM | POA: Diagnosis not present

## 2021-10-05 DIAGNOSIS — Z432 Encounter for attention to ileostomy: Secondary | ICD-10-CM | POA: Diagnosis not present

## 2021-10-05 DIAGNOSIS — R55 Syncope and collapse: Secondary | ICD-10-CM | POA: Diagnosis not present

## 2021-10-05 DIAGNOSIS — Z7901 Long term (current) use of anticoagulants: Secondary | ICD-10-CM | POA: Diagnosis not present

## 2021-10-05 DIAGNOSIS — K631 Perforation of intestine (nontraumatic): Secondary | ICD-10-CM | POA: Diagnosis not present

## 2021-10-06 DIAGNOSIS — J189 Pneumonia, unspecified organism: Secondary | ICD-10-CM | POA: Diagnosis not present

## 2021-10-06 DIAGNOSIS — K631 Perforation of intestine (nontraumatic): Secondary | ICD-10-CM | POA: Diagnosis not present

## 2021-10-06 DIAGNOSIS — R6521 Severe sepsis with septic shock: Secondary | ICD-10-CM | POA: Diagnosis not present

## 2021-10-06 DIAGNOSIS — G9341 Metabolic encephalopathy: Secondary | ICD-10-CM | POA: Diagnosis not present

## 2021-10-06 DIAGNOSIS — Z48815 Encounter for surgical aftercare following surgery on the digestive system: Secondary | ICD-10-CM | POA: Diagnosis not present

## 2021-10-06 DIAGNOSIS — A419 Sepsis, unspecified organism: Secondary | ICD-10-CM | POA: Diagnosis not present

## 2021-10-08 DIAGNOSIS — J189 Pneumonia, unspecified organism: Secondary | ICD-10-CM | POA: Diagnosis not present

## 2021-10-08 DIAGNOSIS — R6521 Severe sepsis with septic shock: Secondary | ICD-10-CM | POA: Diagnosis not present

## 2021-10-08 DIAGNOSIS — Z48815 Encounter for surgical aftercare following surgery on the digestive system: Secondary | ICD-10-CM | POA: Diagnosis not present

## 2021-10-08 DIAGNOSIS — K631 Perforation of intestine (nontraumatic): Secondary | ICD-10-CM | POA: Diagnosis not present

## 2021-10-08 DIAGNOSIS — A419 Sepsis, unspecified organism: Secondary | ICD-10-CM | POA: Diagnosis not present

## 2021-10-08 DIAGNOSIS — G9341 Metabolic encephalopathy: Secondary | ICD-10-CM | POA: Diagnosis not present

## 2021-10-12 DIAGNOSIS — F5104 Psychophysiologic insomnia: Secondary | ICD-10-CM | POA: Diagnosis not present

## 2021-10-12 DIAGNOSIS — D649 Anemia, unspecified: Secondary | ICD-10-CM | POA: Diagnosis not present

## 2021-10-12 DIAGNOSIS — E785 Hyperlipidemia, unspecified: Secondary | ICD-10-CM | POA: Diagnosis not present

## 2021-10-12 DIAGNOSIS — K2960 Other gastritis without bleeding: Secondary | ICD-10-CM | POA: Diagnosis not present

## 2021-10-12 DIAGNOSIS — D6489 Other specified anemias: Secondary | ICD-10-CM | POA: Diagnosis not present

## 2021-10-12 DIAGNOSIS — R7301 Impaired fasting glucose: Secondary | ICD-10-CM | POA: Diagnosis not present

## 2021-10-12 DIAGNOSIS — Z934 Other artificial openings of gastrointestinal tract status: Secondary | ICD-10-CM | POA: Diagnosis not present

## 2021-10-12 DIAGNOSIS — A419 Sepsis, unspecified organism: Secondary | ICD-10-CM | POA: Diagnosis not present

## 2021-10-12 DIAGNOSIS — E559 Vitamin D deficiency, unspecified: Secondary | ICD-10-CM | POA: Diagnosis not present

## 2021-10-12 DIAGNOSIS — Z932 Ileostomy status: Secondary | ICD-10-CM | POA: Diagnosis not present

## 2021-10-12 DIAGNOSIS — G9341 Metabolic encephalopathy: Secondary | ICD-10-CM | POA: Diagnosis not present

## 2021-10-12 DIAGNOSIS — Z79899 Other long term (current) drug therapy: Secondary | ICD-10-CM | POA: Diagnosis not present

## 2021-10-13 DIAGNOSIS — K631 Perforation of intestine (nontraumatic): Secondary | ICD-10-CM | POA: Diagnosis not present

## 2021-10-13 DIAGNOSIS — J189 Pneumonia, unspecified organism: Secondary | ICD-10-CM | POA: Diagnosis not present

## 2021-10-13 DIAGNOSIS — Z48815 Encounter for surgical aftercare following surgery on the digestive system: Secondary | ICD-10-CM | POA: Diagnosis not present

## 2021-10-13 DIAGNOSIS — R6521 Severe sepsis with septic shock: Secondary | ICD-10-CM | POA: Diagnosis not present

## 2021-10-13 DIAGNOSIS — A419 Sepsis, unspecified organism: Secondary | ICD-10-CM | POA: Diagnosis not present

## 2021-10-13 DIAGNOSIS — G9341 Metabolic encephalopathy: Secondary | ICD-10-CM | POA: Diagnosis not present

## 2021-10-14 DIAGNOSIS — G9341 Metabolic encephalopathy: Secondary | ICD-10-CM | POA: Diagnosis not present

## 2021-10-14 DIAGNOSIS — R6521 Severe sepsis with septic shock: Secondary | ICD-10-CM | POA: Diagnosis not present

## 2021-10-14 DIAGNOSIS — K631 Perforation of intestine (nontraumatic): Secondary | ICD-10-CM | POA: Diagnosis not present

## 2021-10-14 DIAGNOSIS — Z48815 Encounter for surgical aftercare following surgery on the digestive system: Secondary | ICD-10-CM | POA: Diagnosis not present

## 2021-10-14 DIAGNOSIS — J189 Pneumonia, unspecified organism: Secondary | ICD-10-CM | POA: Diagnosis not present

## 2021-10-14 DIAGNOSIS — A419 Sepsis, unspecified organism: Secondary | ICD-10-CM | POA: Diagnosis not present

## 2021-10-15 DIAGNOSIS — Z48815 Encounter for surgical aftercare following surgery on the digestive system: Secondary | ICD-10-CM | POA: Diagnosis not present

## 2021-10-15 DIAGNOSIS — A419 Sepsis, unspecified organism: Secondary | ICD-10-CM | POA: Diagnosis not present

## 2021-10-15 DIAGNOSIS — G9341 Metabolic encephalopathy: Secondary | ICD-10-CM | POA: Diagnosis not present

## 2021-10-15 DIAGNOSIS — K631 Perforation of intestine (nontraumatic): Secondary | ICD-10-CM | POA: Diagnosis not present

## 2021-10-15 DIAGNOSIS — J189 Pneumonia, unspecified organism: Secondary | ICD-10-CM | POA: Diagnosis not present

## 2021-10-15 DIAGNOSIS — R6521 Severe sepsis with septic shock: Secondary | ICD-10-CM | POA: Diagnosis not present

## 2021-10-20 DIAGNOSIS — A419 Sepsis, unspecified organism: Secondary | ICD-10-CM | POA: Diagnosis not present

## 2021-10-20 DIAGNOSIS — Z48815 Encounter for surgical aftercare following surgery on the digestive system: Secondary | ICD-10-CM | POA: Diagnosis not present

## 2021-10-20 DIAGNOSIS — G9341 Metabolic encephalopathy: Secondary | ICD-10-CM | POA: Diagnosis not present

## 2021-10-20 DIAGNOSIS — K631 Perforation of intestine (nontraumatic): Secondary | ICD-10-CM | POA: Diagnosis not present

## 2021-10-20 DIAGNOSIS — R6521 Severe sepsis with septic shock: Secondary | ICD-10-CM | POA: Diagnosis not present

## 2021-10-20 DIAGNOSIS — J189 Pneumonia, unspecified organism: Secondary | ICD-10-CM | POA: Diagnosis not present

## 2021-10-21 DIAGNOSIS — A419 Sepsis, unspecified organism: Secondary | ICD-10-CM | POA: Diagnosis not present

## 2021-10-21 DIAGNOSIS — Z48815 Encounter for surgical aftercare following surgery on the digestive system: Secondary | ICD-10-CM | POA: Diagnosis not present

## 2021-10-21 DIAGNOSIS — K631 Perforation of intestine (nontraumatic): Secondary | ICD-10-CM | POA: Diagnosis not present

## 2021-10-21 DIAGNOSIS — G9341 Metabolic encephalopathy: Secondary | ICD-10-CM | POA: Diagnosis not present

## 2021-10-21 DIAGNOSIS — J189 Pneumonia, unspecified organism: Secondary | ICD-10-CM | POA: Diagnosis not present

## 2021-10-21 DIAGNOSIS — R6521 Severe sepsis with septic shock: Secondary | ICD-10-CM | POA: Diagnosis not present

## 2021-10-23 ENCOUNTER — Telehealth: Payer: Self-pay | Admitting: Cardiology

## 2021-10-23 MED ORDER — AMLODIPINE BESYLATE 5 MG PO TABS: 5.0000 mg | ORAL_TABLET | Freq: Every day | ORAL | 1 refills | Status: DC

## 2021-10-23 NOTE — Telephone Encounter (Signed)
Returned the call to the patient. She was calling to see if Dr. Bettina Gavia would refill her Amlodipine 5 mg. She was at [facility] rehab from November through January and was placed back on Amlodipine 5 mg. She was advised to call her cardiologist for a refill.  She does not check her blood pressure at home. Next follow up is due in May 2023.

## 2021-10-23 NOTE — Telephone Encounter (Signed)
° °  Pt c/o medication issue:  1. Name of Medication: amlodepine 5mg   2. How are you currently taking this medication (dosage and times per day)?   3. Are you having a reaction (difficulty breathing--STAT)?   4. What is your medication issue? Pt said she's been taking this meds, she needs refill but its not on her meds list

## 2021-10-23 NOTE — Telephone Encounter (Signed)
Patient has been made aware. Amlodipine 5 mg has been sent in for her, per Dr. Christiana Lions, Hilton Cork, MD  You 11 minutes ago (2:50 PM)   Yes

## 2021-10-29 DIAGNOSIS — G9341 Metabolic encephalopathy: Secondary | ICD-10-CM | POA: Diagnosis not present

## 2021-10-29 DIAGNOSIS — R6521 Severe sepsis with septic shock: Secondary | ICD-10-CM | POA: Diagnosis not present

## 2021-10-29 DIAGNOSIS — K631 Perforation of intestine (nontraumatic): Secondary | ICD-10-CM | POA: Diagnosis not present

## 2021-10-29 DIAGNOSIS — Z48815 Encounter for surgical aftercare following surgery on the digestive system: Secondary | ICD-10-CM | POA: Diagnosis not present

## 2021-10-29 DIAGNOSIS — J189 Pneumonia, unspecified organism: Secondary | ICD-10-CM | POA: Diagnosis not present

## 2021-10-29 DIAGNOSIS — A419 Sepsis, unspecified organism: Secondary | ICD-10-CM | POA: Diagnosis not present

## 2021-10-30 DIAGNOSIS — G9341 Metabolic encephalopathy: Secondary | ICD-10-CM | POA: Diagnosis not present

## 2021-10-30 DIAGNOSIS — R6521 Severe sepsis with septic shock: Secondary | ICD-10-CM | POA: Diagnosis not present

## 2021-10-30 DIAGNOSIS — A419 Sepsis, unspecified organism: Secondary | ICD-10-CM | POA: Diagnosis not present

## 2021-10-30 DIAGNOSIS — J189 Pneumonia, unspecified organism: Secondary | ICD-10-CM | POA: Diagnosis not present

## 2021-10-30 DIAGNOSIS — K631 Perforation of intestine (nontraumatic): Secondary | ICD-10-CM | POA: Diagnosis not present

## 2021-10-30 DIAGNOSIS — Z48815 Encounter for surgical aftercare following surgery on the digestive system: Secondary | ICD-10-CM | POA: Diagnosis not present

## 2021-11-02 DIAGNOSIS — G9341 Metabolic encephalopathy: Secondary | ICD-10-CM | POA: Diagnosis not present

## 2021-11-02 DIAGNOSIS — R6521 Severe sepsis with septic shock: Secondary | ICD-10-CM | POA: Diagnosis not present

## 2021-11-02 DIAGNOSIS — Z48815 Encounter for surgical aftercare following surgery on the digestive system: Secondary | ICD-10-CM | POA: Diagnosis not present

## 2021-11-02 DIAGNOSIS — K631 Perforation of intestine (nontraumatic): Secondary | ICD-10-CM | POA: Diagnosis not present

## 2021-11-02 DIAGNOSIS — J189 Pneumonia, unspecified organism: Secondary | ICD-10-CM | POA: Diagnosis not present

## 2021-11-02 DIAGNOSIS — A419 Sepsis, unspecified organism: Secondary | ICD-10-CM | POA: Diagnosis not present

## 2021-11-04 DIAGNOSIS — N184 Chronic kidney disease, stage 4 (severe): Secondary | ICD-10-CM | POA: Diagnosis not present

## 2021-11-04 DIAGNOSIS — F419 Anxiety disorder, unspecified: Secondary | ICD-10-CM | POA: Diagnosis not present

## 2021-11-04 DIAGNOSIS — Z48815 Encounter for surgical aftercare following surgery on the digestive system: Secondary | ICD-10-CM | POA: Diagnosis not present

## 2021-11-04 DIAGNOSIS — Z9049 Acquired absence of other specified parts of digestive tract: Secondary | ICD-10-CM | POA: Diagnosis not present

## 2021-11-04 DIAGNOSIS — R6521 Severe sepsis with septic shock: Secondary | ICD-10-CM | POA: Diagnosis not present

## 2021-11-04 DIAGNOSIS — J189 Pneumonia, unspecified organism: Secondary | ICD-10-CM | POA: Diagnosis not present

## 2021-11-04 DIAGNOSIS — Z7901 Long term (current) use of anticoagulants: Secondary | ICD-10-CM | POA: Diagnosis not present

## 2021-11-04 DIAGNOSIS — G479 Sleep disorder, unspecified: Secondary | ICD-10-CM | POA: Diagnosis not present

## 2021-11-04 DIAGNOSIS — I482 Chronic atrial fibrillation, unspecified: Secondary | ICD-10-CM | POA: Diagnosis not present

## 2021-11-04 DIAGNOSIS — Z9181 History of falling: Secondary | ICD-10-CM | POA: Diagnosis not present

## 2021-11-04 DIAGNOSIS — G9341 Metabolic encephalopathy: Secondary | ICD-10-CM | POA: Diagnosis not present

## 2021-11-04 DIAGNOSIS — K632 Fistula of intestine: Secondary | ICD-10-CM | POA: Diagnosis not present

## 2021-11-04 DIAGNOSIS — A419 Sepsis, unspecified organism: Secondary | ICD-10-CM | POA: Diagnosis not present

## 2021-11-04 DIAGNOSIS — N179 Acute kidney failure, unspecified: Secondary | ICD-10-CM | POA: Diagnosis not present

## 2021-11-04 DIAGNOSIS — Z7951 Long term (current) use of inhaled steroids: Secondary | ICD-10-CM | POA: Diagnosis not present

## 2021-11-04 DIAGNOSIS — E876 Hypokalemia: Secondary | ICD-10-CM | POA: Diagnosis not present

## 2021-11-04 DIAGNOSIS — R55 Syncope and collapse: Secondary | ICD-10-CM | POA: Diagnosis not present

## 2021-11-04 DIAGNOSIS — Z432 Encounter for attention to ileostomy: Secondary | ICD-10-CM | POA: Diagnosis not present

## 2021-11-04 DIAGNOSIS — I129 Hypertensive chronic kidney disease with stage 1 through stage 4 chronic kidney disease, or unspecified chronic kidney disease: Secondary | ICD-10-CM | POA: Diagnosis not present

## 2021-11-04 DIAGNOSIS — K631 Perforation of intestine (nontraumatic): Secondary | ICD-10-CM | POA: Diagnosis not present

## 2021-11-06 DIAGNOSIS — R6521 Severe sepsis with septic shock: Secondary | ICD-10-CM | POA: Diagnosis not present

## 2021-11-06 DIAGNOSIS — J189 Pneumonia, unspecified organism: Secondary | ICD-10-CM | POA: Diagnosis not present

## 2021-11-06 DIAGNOSIS — Z48815 Encounter for surgical aftercare following surgery on the digestive system: Secondary | ICD-10-CM | POA: Diagnosis not present

## 2021-11-06 DIAGNOSIS — K631 Perforation of intestine (nontraumatic): Secondary | ICD-10-CM | POA: Diagnosis not present

## 2021-11-06 DIAGNOSIS — G9341 Metabolic encephalopathy: Secondary | ICD-10-CM | POA: Diagnosis not present

## 2021-11-06 DIAGNOSIS — A419 Sepsis, unspecified organism: Secondary | ICD-10-CM | POA: Diagnosis not present

## 2021-11-09 DIAGNOSIS — J189 Pneumonia, unspecified organism: Secondary | ICD-10-CM | POA: Diagnosis not present

## 2021-11-09 DIAGNOSIS — Z48815 Encounter for surgical aftercare following surgery on the digestive system: Secondary | ICD-10-CM | POA: Diagnosis not present

## 2021-11-09 DIAGNOSIS — A419 Sepsis, unspecified organism: Secondary | ICD-10-CM | POA: Diagnosis not present

## 2021-11-09 DIAGNOSIS — K631 Perforation of intestine (nontraumatic): Secondary | ICD-10-CM | POA: Diagnosis not present

## 2021-11-09 DIAGNOSIS — G9341 Metabolic encephalopathy: Secondary | ICD-10-CM | POA: Diagnosis not present

## 2021-11-09 DIAGNOSIS — R6521 Severe sepsis with septic shock: Secondary | ICD-10-CM | POA: Diagnosis not present

## 2021-11-11 DIAGNOSIS — R6521 Severe sepsis with septic shock: Secondary | ICD-10-CM | POA: Diagnosis not present

## 2021-11-11 DIAGNOSIS — A419 Sepsis, unspecified organism: Secondary | ICD-10-CM | POA: Diagnosis not present

## 2021-11-11 DIAGNOSIS — G9341 Metabolic encephalopathy: Secondary | ICD-10-CM | POA: Diagnosis not present

## 2021-11-11 DIAGNOSIS — J189 Pneumonia, unspecified organism: Secondary | ICD-10-CM | POA: Diagnosis not present

## 2021-11-11 DIAGNOSIS — K631 Perforation of intestine (nontraumatic): Secondary | ICD-10-CM | POA: Diagnosis not present

## 2021-11-11 DIAGNOSIS — Z48815 Encounter for surgical aftercare following surgery on the digestive system: Secondary | ICD-10-CM | POA: Diagnosis not present

## 2021-11-12 DIAGNOSIS — R6521 Severe sepsis with septic shock: Secondary | ICD-10-CM | POA: Diagnosis not present

## 2021-11-12 DIAGNOSIS — G9341 Metabolic encephalopathy: Secondary | ICD-10-CM | POA: Diagnosis not present

## 2021-11-12 DIAGNOSIS — J189 Pneumonia, unspecified organism: Secondary | ICD-10-CM | POA: Diagnosis not present

## 2021-11-12 DIAGNOSIS — A419 Sepsis, unspecified organism: Secondary | ICD-10-CM | POA: Diagnosis not present

## 2021-11-12 DIAGNOSIS — Z48815 Encounter for surgical aftercare following surgery on the digestive system: Secondary | ICD-10-CM | POA: Diagnosis not present

## 2021-11-12 DIAGNOSIS — K631 Perforation of intestine (nontraumatic): Secondary | ICD-10-CM | POA: Diagnosis not present

## 2021-11-17 DIAGNOSIS — Z48815 Encounter for surgical aftercare following surgery on the digestive system: Secondary | ICD-10-CM | POA: Diagnosis not present

## 2021-11-17 DIAGNOSIS — R6521 Severe sepsis with septic shock: Secondary | ICD-10-CM | POA: Diagnosis not present

## 2021-11-17 DIAGNOSIS — J189 Pneumonia, unspecified organism: Secondary | ICD-10-CM | POA: Diagnosis not present

## 2021-11-17 DIAGNOSIS — G9341 Metabolic encephalopathy: Secondary | ICD-10-CM | POA: Diagnosis not present

## 2021-11-17 DIAGNOSIS — A419 Sepsis, unspecified organism: Secondary | ICD-10-CM | POA: Diagnosis not present

## 2021-11-17 DIAGNOSIS — K631 Perforation of intestine (nontraumatic): Secondary | ICD-10-CM | POA: Diagnosis not present

## 2021-11-19 ENCOUNTER — Other Ambulatory Visit: Payer: Self-pay | Admitting: Cardiology

## 2021-11-19 DIAGNOSIS — J189 Pneumonia, unspecified organism: Secondary | ICD-10-CM | POA: Diagnosis not present

## 2021-11-19 DIAGNOSIS — A419 Sepsis, unspecified organism: Secondary | ICD-10-CM | POA: Diagnosis not present

## 2021-11-19 DIAGNOSIS — K631 Perforation of intestine (nontraumatic): Secondary | ICD-10-CM | POA: Diagnosis not present

## 2021-11-19 DIAGNOSIS — R6521 Severe sepsis with septic shock: Secondary | ICD-10-CM | POA: Diagnosis not present

## 2021-11-19 DIAGNOSIS — G9341 Metabolic encephalopathy: Secondary | ICD-10-CM | POA: Diagnosis not present

## 2021-11-19 DIAGNOSIS — Z48815 Encounter for surgical aftercare following surgery on the digestive system: Secondary | ICD-10-CM | POA: Diagnosis not present

## 2021-11-20 DIAGNOSIS — Z7189 Other specified counseling: Secondary | ICD-10-CM | POA: Diagnosis not present

## 2021-11-25 ENCOUNTER — Other Ambulatory Visit: Payer: Self-pay | Admitting: Cardiology

## 2021-11-25 DIAGNOSIS — J189 Pneumonia, unspecified organism: Secondary | ICD-10-CM | POA: Diagnosis not present

## 2021-11-25 DIAGNOSIS — G9341 Metabolic encephalopathy: Secondary | ICD-10-CM | POA: Diagnosis not present

## 2021-11-25 DIAGNOSIS — A419 Sepsis, unspecified organism: Secondary | ICD-10-CM | POA: Diagnosis not present

## 2021-11-25 DIAGNOSIS — Z48815 Encounter for surgical aftercare following surgery on the digestive system: Secondary | ICD-10-CM | POA: Diagnosis not present

## 2021-11-25 DIAGNOSIS — K631 Perforation of intestine (nontraumatic): Secondary | ICD-10-CM | POA: Diagnosis not present

## 2021-11-25 DIAGNOSIS — R6521 Severe sepsis with septic shock: Secondary | ICD-10-CM | POA: Diagnosis not present

## 2021-11-26 DIAGNOSIS — K631 Perforation of intestine (nontraumatic): Secondary | ICD-10-CM | POA: Diagnosis not present

## 2021-11-26 DIAGNOSIS — R6521 Severe sepsis with septic shock: Secondary | ICD-10-CM | POA: Diagnosis not present

## 2021-11-26 DIAGNOSIS — G9341 Metabolic encephalopathy: Secondary | ICD-10-CM | POA: Diagnosis not present

## 2021-11-26 DIAGNOSIS — J189 Pneumonia, unspecified organism: Secondary | ICD-10-CM | POA: Diagnosis not present

## 2021-11-26 DIAGNOSIS — A419 Sepsis, unspecified organism: Secondary | ICD-10-CM | POA: Diagnosis not present

## 2021-11-26 DIAGNOSIS — Z48815 Encounter for surgical aftercare following surgery on the digestive system: Secondary | ICD-10-CM | POA: Diagnosis not present

## 2021-11-30 DIAGNOSIS — G9341 Metabolic encephalopathy: Secondary | ICD-10-CM | POA: Diagnosis not present

## 2021-11-30 DIAGNOSIS — J189 Pneumonia, unspecified organism: Secondary | ICD-10-CM | POA: Diagnosis not present

## 2021-11-30 DIAGNOSIS — R6521 Severe sepsis with septic shock: Secondary | ICD-10-CM | POA: Diagnosis not present

## 2021-11-30 DIAGNOSIS — K631 Perforation of intestine (nontraumatic): Secondary | ICD-10-CM | POA: Diagnosis not present

## 2021-11-30 DIAGNOSIS — Z48815 Encounter for surgical aftercare following surgery on the digestive system: Secondary | ICD-10-CM | POA: Diagnosis not present

## 2021-11-30 DIAGNOSIS — A419 Sepsis, unspecified organism: Secondary | ICD-10-CM | POA: Diagnosis not present

## 2021-12-01 DIAGNOSIS — K56609 Unspecified intestinal obstruction, unspecified as to partial versus complete obstruction: Secondary | ICD-10-CM | POA: Diagnosis not present

## 2021-12-01 DIAGNOSIS — Z9889 Other specified postprocedural states: Secondary | ICD-10-CM | POA: Diagnosis not present

## 2021-12-01 DIAGNOSIS — Z9049 Acquired absence of other specified parts of digestive tract: Secondary | ICD-10-CM | POA: Diagnosis not present

## 2021-12-02 DIAGNOSIS — Z48815 Encounter for surgical aftercare following surgery on the digestive system: Secondary | ICD-10-CM | POA: Diagnosis not present

## 2021-12-02 DIAGNOSIS — G9341 Metabolic encephalopathy: Secondary | ICD-10-CM | POA: Diagnosis not present

## 2021-12-02 DIAGNOSIS — J189 Pneumonia, unspecified organism: Secondary | ICD-10-CM | POA: Diagnosis not present

## 2021-12-02 DIAGNOSIS — R6521 Severe sepsis with septic shock: Secondary | ICD-10-CM | POA: Diagnosis not present

## 2021-12-02 DIAGNOSIS — A419 Sepsis, unspecified organism: Secondary | ICD-10-CM | POA: Diagnosis not present

## 2021-12-02 DIAGNOSIS — K631 Perforation of intestine (nontraumatic): Secondary | ICD-10-CM | POA: Diagnosis not present

## 2021-12-04 DIAGNOSIS — N184 Chronic kidney disease, stage 4 (severe): Secondary | ICD-10-CM | POA: Diagnosis not present

## 2021-12-04 DIAGNOSIS — Z9049 Acquired absence of other specified parts of digestive tract: Secondary | ICD-10-CM | POA: Diagnosis not present

## 2021-12-04 DIAGNOSIS — R55 Syncope and collapse: Secondary | ICD-10-CM | POA: Diagnosis not present

## 2021-12-04 DIAGNOSIS — F419 Anxiety disorder, unspecified: Secondary | ICD-10-CM | POA: Diagnosis not present

## 2021-12-04 DIAGNOSIS — K631 Perforation of intestine (nontraumatic): Secondary | ICD-10-CM | POA: Diagnosis not present

## 2021-12-04 DIAGNOSIS — Z48815 Encounter for surgical aftercare following surgery on the digestive system: Secondary | ICD-10-CM | POA: Diagnosis not present

## 2021-12-04 DIAGNOSIS — Z7901 Long term (current) use of anticoagulants: Secondary | ICD-10-CM | POA: Diagnosis not present

## 2021-12-04 DIAGNOSIS — Z432 Encounter for attention to ileostomy: Secondary | ICD-10-CM | POA: Diagnosis not present

## 2021-12-04 DIAGNOSIS — Z9181 History of falling: Secondary | ICD-10-CM | POA: Diagnosis not present

## 2021-12-04 DIAGNOSIS — I482 Chronic atrial fibrillation, unspecified: Secondary | ICD-10-CM | POA: Diagnosis not present

## 2021-12-04 DIAGNOSIS — Z7951 Long term (current) use of inhaled steroids: Secondary | ICD-10-CM | POA: Diagnosis not present

## 2021-12-04 DIAGNOSIS — K632 Fistula of intestine: Secondary | ICD-10-CM | POA: Diagnosis not present

## 2021-12-04 DIAGNOSIS — I129 Hypertensive chronic kidney disease with stage 1 through stage 4 chronic kidney disease, or unspecified chronic kidney disease: Secondary | ICD-10-CM | POA: Diagnosis not present

## 2021-12-04 DIAGNOSIS — E876 Hypokalemia: Secondary | ICD-10-CM | POA: Diagnosis not present

## 2021-12-04 DIAGNOSIS — G479 Sleep disorder, unspecified: Secondary | ICD-10-CM | POA: Diagnosis not present

## 2021-12-07 DIAGNOSIS — I129 Hypertensive chronic kidney disease with stage 1 through stage 4 chronic kidney disease, or unspecified chronic kidney disease: Secondary | ICD-10-CM | POA: Diagnosis not present

## 2021-12-07 DIAGNOSIS — F419 Anxiety disorder, unspecified: Secondary | ICD-10-CM | POA: Diagnosis not present

## 2021-12-07 DIAGNOSIS — Z432 Encounter for attention to ileostomy: Secondary | ICD-10-CM | POA: Diagnosis not present

## 2021-12-07 DIAGNOSIS — I482 Chronic atrial fibrillation, unspecified: Secondary | ICD-10-CM | POA: Diagnosis not present

## 2021-12-07 DIAGNOSIS — Z48815 Encounter for surgical aftercare following surgery on the digestive system: Secondary | ICD-10-CM | POA: Diagnosis not present

## 2021-12-07 DIAGNOSIS — G479 Sleep disorder, unspecified: Secondary | ICD-10-CM | POA: Diagnosis not present

## 2021-12-07 DIAGNOSIS — N184 Chronic kidney disease, stage 4 (severe): Secondary | ICD-10-CM | POA: Diagnosis not present

## 2021-12-07 DIAGNOSIS — K631 Perforation of intestine (nontraumatic): Secondary | ICD-10-CM | POA: Diagnosis not present

## 2021-12-07 DIAGNOSIS — K632 Fistula of intestine: Secondary | ICD-10-CM | POA: Diagnosis not present

## 2021-12-08 DIAGNOSIS — N184 Chronic kidney disease, stage 4 (severe): Secondary | ICD-10-CM | POA: Diagnosis not present

## 2021-12-08 DIAGNOSIS — Z432 Encounter for attention to ileostomy: Secondary | ICD-10-CM | POA: Diagnosis not present

## 2021-12-08 DIAGNOSIS — K631 Perforation of intestine (nontraumatic): Secondary | ICD-10-CM | POA: Diagnosis not present

## 2021-12-08 DIAGNOSIS — I129 Hypertensive chronic kidney disease with stage 1 through stage 4 chronic kidney disease, or unspecified chronic kidney disease: Secondary | ICD-10-CM | POA: Diagnosis not present

## 2021-12-08 DIAGNOSIS — Z48815 Encounter for surgical aftercare following surgery on the digestive system: Secondary | ICD-10-CM | POA: Diagnosis not present

## 2021-12-08 DIAGNOSIS — K632 Fistula of intestine: Secondary | ICD-10-CM | POA: Diagnosis not present

## 2021-12-10 DIAGNOSIS — I129 Hypertensive chronic kidney disease with stage 1 through stage 4 chronic kidney disease, or unspecified chronic kidney disease: Secondary | ICD-10-CM | POA: Diagnosis not present

## 2021-12-10 DIAGNOSIS — K631 Perforation of intestine (nontraumatic): Secondary | ICD-10-CM | POA: Diagnosis not present

## 2021-12-10 DIAGNOSIS — K632 Fistula of intestine: Secondary | ICD-10-CM | POA: Diagnosis not present

## 2021-12-10 DIAGNOSIS — Z48815 Encounter for surgical aftercare following surgery on the digestive system: Secondary | ICD-10-CM | POA: Diagnosis not present

## 2021-12-10 DIAGNOSIS — Z432 Encounter for attention to ileostomy: Secondary | ICD-10-CM | POA: Diagnosis not present

## 2021-12-10 DIAGNOSIS — N184 Chronic kidney disease, stage 4 (severe): Secondary | ICD-10-CM | POA: Diagnosis not present

## 2021-12-15 DIAGNOSIS — K632 Fistula of intestine: Secondary | ICD-10-CM | POA: Diagnosis not present

## 2021-12-15 DIAGNOSIS — Z432 Encounter for attention to ileostomy: Secondary | ICD-10-CM | POA: Diagnosis not present

## 2021-12-15 DIAGNOSIS — N184 Chronic kidney disease, stage 4 (severe): Secondary | ICD-10-CM | POA: Diagnosis not present

## 2021-12-15 DIAGNOSIS — K631 Perforation of intestine (nontraumatic): Secondary | ICD-10-CM | POA: Diagnosis not present

## 2021-12-15 DIAGNOSIS — I129 Hypertensive chronic kidney disease with stage 1 through stage 4 chronic kidney disease, or unspecified chronic kidney disease: Secondary | ICD-10-CM | POA: Diagnosis not present

## 2021-12-15 DIAGNOSIS — Z48815 Encounter for surgical aftercare following surgery on the digestive system: Secondary | ICD-10-CM | POA: Diagnosis not present

## 2021-12-16 DIAGNOSIS — K632 Fistula of intestine: Secondary | ICD-10-CM | POA: Diagnosis not present

## 2021-12-16 DIAGNOSIS — I129 Hypertensive chronic kidney disease with stage 1 through stage 4 chronic kidney disease, or unspecified chronic kidney disease: Secondary | ICD-10-CM | POA: Diagnosis not present

## 2021-12-16 DIAGNOSIS — Z48815 Encounter for surgical aftercare following surgery on the digestive system: Secondary | ICD-10-CM | POA: Diagnosis not present

## 2021-12-16 DIAGNOSIS — K631 Perforation of intestine (nontraumatic): Secondary | ICD-10-CM | POA: Diagnosis not present

## 2021-12-16 DIAGNOSIS — Z432 Encounter for attention to ileostomy: Secondary | ICD-10-CM | POA: Diagnosis not present

## 2021-12-16 DIAGNOSIS — N184 Chronic kidney disease, stage 4 (severe): Secondary | ICD-10-CM | POA: Diagnosis not present

## 2021-12-21 DIAGNOSIS — N184 Chronic kidney disease, stage 4 (severe): Secondary | ICD-10-CM | POA: Diagnosis not present

## 2021-12-21 DIAGNOSIS — K631 Perforation of intestine (nontraumatic): Secondary | ICD-10-CM | POA: Diagnosis not present

## 2021-12-21 DIAGNOSIS — K632 Fistula of intestine: Secondary | ICD-10-CM | POA: Diagnosis not present

## 2021-12-21 DIAGNOSIS — Z48815 Encounter for surgical aftercare following surgery on the digestive system: Secondary | ICD-10-CM | POA: Diagnosis not present

## 2021-12-21 DIAGNOSIS — I129 Hypertensive chronic kidney disease with stage 1 through stage 4 chronic kidney disease, or unspecified chronic kidney disease: Secondary | ICD-10-CM | POA: Diagnosis not present

## 2021-12-21 DIAGNOSIS — Z432 Encounter for attention to ileostomy: Secondary | ICD-10-CM | POA: Diagnosis not present

## 2021-12-22 DIAGNOSIS — E785 Hyperlipidemia, unspecified: Secondary | ICD-10-CM | POA: Diagnosis not present

## 2021-12-22 DIAGNOSIS — N184 Chronic kidney disease, stage 4 (severe): Secondary | ICD-10-CM | POA: Diagnosis not present

## 2021-12-22 DIAGNOSIS — K632 Fistula of intestine: Secondary | ICD-10-CM | POA: Diagnosis not present

## 2021-12-22 DIAGNOSIS — Z932 Ileostomy status: Secondary | ICD-10-CM | POA: Diagnosis not present

## 2021-12-22 DIAGNOSIS — Z432 Encounter for attention to ileostomy: Secondary | ICD-10-CM | POA: Diagnosis not present

## 2021-12-22 DIAGNOSIS — I129 Hypertensive chronic kidney disease with stage 1 through stage 4 chronic kidney disease, or unspecified chronic kidney disease: Secondary | ICD-10-CM | POA: Diagnosis not present

## 2021-12-22 DIAGNOSIS — Z934 Other artificial openings of gastrointestinal tract status: Secondary | ICD-10-CM | POA: Diagnosis not present

## 2021-12-22 DIAGNOSIS — K631 Perforation of intestine (nontraumatic): Secondary | ICD-10-CM | POA: Diagnosis not present

## 2021-12-22 DIAGNOSIS — Z48815 Encounter for surgical aftercare following surgery on the digestive system: Secondary | ICD-10-CM | POA: Diagnosis not present

## 2021-12-22 DIAGNOSIS — I10 Essential (primary) hypertension: Secondary | ICD-10-CM | POA: Diagnosis not present

## 2021-12-23 DIAGNOSIS — K631 Perforation of intestine (nontraumatic): Secondary | ICD-10-CM | POA: Diagnosis not present

## 2021-12-23 DIAGNOSIS — Z432 Encounter for attention to ileostomy: Secondary | ICD-10-CM | POA: Diagnosis not present

## 2021-12-23 DIAGNOSIS — Z48815 Encounter for surgical aftercare following surgery on the digestive system: Secondary | ICD-10-CM | POA: Diagnosis not present

## 2021-12-23 DIAGNOSIS — I129 Hypertensive chronic kidney disease with stage 1 through stage 4 chronic kidney disease, or unspecified chronic kidney disease: Secondary | ICD-10-CM | POA: Diagnosis not present

## 2021-12-23 DIAGNOSIS — K632 Fistula of intestine: Secondary | ICD-10-CM | POA: Diagnosis not present

## 2021-12-23 DIAGNOSIS — N184 Chronic kidney disease, stage 4 (severe): Secondary | ICD-10-CM | POA: Diagnosis not present

## 2021-12-28 ENCOUNTER — Other Ambulatory Visit: Payer: Self-pay | Admitting: Cardiology

## 2021-12-29 DIAGNOSIS — N184 Chronic kidney disease, stage 4 (severe): Secondary | ICD-10-CM | POA: Diagnosis not present

## 2021-12-29 DIAGNOSIS — K631 Perforation of intestine (nontraumatic): Secondary | ICD-10-CM | POA: Diagnosis not present

## 2021-12-29 DIAGNOSIS — Z48815 Encounter for surgical aftercare following surgery on the digestive system: Secondary | ICD-10-CM | POA: Diagnosis not present

## 2021-12-29 DIAGNOSIS — Z432 Encounter for attention to ileostomy: Secondary | ICD-10-CM | POA: Diagnosis not present

## 2021-12-29 DIAGNOSIS — I129 Hypertensive chronic kidney disease with stage 1 through stage 4 chronic kidney disease, or unspecified chronic kidney disease: Secondary | ICD-10-CM | POA: Diagnosis not present

## 2021-12-29 DIAGNOSIS — K632 Fistula of intestine: Secondary | ICD-10-CM | POA: Diagnosis not present

## 2021-12-30 DIAGNOSIS — I129 Hypertensive chronic kidney disease with stage 1 through stage 4 chronic kidney disease, or unspecified chronic kidney disease: Secondary | ICD-10-CM | POA: Diagnosis not present

## 2021-12-30 DIAGNOSIS — K632 Fistula of intestine: Secondary | ICD-10-CM | POA: Diagnosis not present

## 2021-12-30 DIAGNOSIS — K631 Perforation of intestine (nontraumatic): Secondary | ICD-10-CM | POA: Diagnosis not present

## 2021-12-30 DIAGNOSIS — Z432 Encounter for attention to ileostomy: Secondary | ICD-10-CM | POA: Diagnosis not present

## 2021-12-30 DIAGNOSIS — Z48815 Encounter for surgical aftercare following surgery on the digestive system: Secondary | ICD-10-CM | POA: Diagnosis not present

## 2021-12-30 DIAGNOSIS — N184 Chronic kidney disease, stage 4 (severe): Secondary | ICD-10-CM | POA: Diagnosis not present

## 2022-01-03 DIAGNOSIS — Z9049 Acquired absence of other specified parts of digestive tract: Secondary | ICD-10-CM | POA: Diagnosis not present

## 2022-01-03 DIAGNOSIS — K631 Perforation of intestine (nontraumatic): Secondary | ICD-10-CM | POA: Diagnosis not present

## 2022-01-03 DIAGNOSIS — G479 Sleep disorder, unspecified: Secondary | ICD-10-CM | POA: Diagnosis not present

## 2022-01-03 DIAGNOSIS — F419 Anxiety disorder, unspecified: Secondary | ICD-10-CM | POA: Diagnosis not present

## 2022-01-03 DIAGNOSIS — N184 Chronic kidney disease, stage 4 (severe): Secondary | ICD-10-CM | POA: Diagnosis not present

## 2022-01-03 DIAGNOSIS — K632 Fistula of intestine: Secondary | ICD-10-CM | POA: Diagnosis not present

## 2022-01-03 DIAGNOSIS — Z7951 Long term (current) use of inhaled steroids: Secondary | ICD-10-CM | POA: Diagnosis not present

## 2022-01-03 DIAGNOSIS — E876 Hypokalemia: Secondary | ICD-10-CM | POA: Diagnosis not present

## 2022-01-03 DIAGNOSIS — I482 Chronic atrial fibrillation, unspecified: Secondary | ICD-10-CM | POA: Diagnosis not present

## 2022-01-03 DIAGNOSIS — Z9181 History of falling: Secondary | ICD-10-CM | POA: Diagnosis not present

## 2022-01-03 DIAGNOSIS — I129 Hypertensive chronic kidney disease with stage 1 through stage 4 chronic kidney disease, or unspecified chronic kidney disease: Secondary | ICD-10-CM | POA: Diagnosis not present

## 2022-01-03 DIAGNOSIS — Z48815 Encounter for surgical aftercare following surgery on the digestive system: Secondary | ICD-10-CM | POA: Diagnosis not present

## 2022-01-03 DIAGNOSIS — Z432 Encounter for attention to ileostomy: Secondary | ICD-10-CM | POA: Diagnosis not present

## 2022-01-03 DIAGNOSIS — Z7901 Long term (current) use of anticoagulants: Secondary | ICD-10-CM | POA: Diagnosis not present

## 2022-01-03 DIAGNOSIS — R55 Syncope and collapse: Secondary | ICD-10-CM | POA: Diagnosis not present

## 2022-01-05 DIAGNOSIS — K56609 Unspecified intestinal obstruction, unspecified as to partial versus complete obstruction: Secondary | ICD-10-CM | POA: Diagnosis not present

## 2022-01-05 DIAGNOSIS — R198 Other specified symptoms and signs involving the digestive system and abdomen: Secondary | ICD-10-CM | POA: Diagnosis not present

## 2022-01-06 DIAGNOSIS — Z48815 Encounter for surgical aftercare following surgery on the digestive system: Secondary | ICD-10-CM | POA: Diagnosis not present

## 2022-01-06 DIAGNOSIS — N184 Chronic kidney disease, stage 4 (severe): Secondary | ICD-10-CM | POA: Diagnosis not present

## 2022-01-06 DIAGNOSIS — I129 Hypertensive chronic kidney disease with stage 1 through stage 4 chronic kidney disease, or unspecified chronic kidney disease: Secondary | ICD-10-CM | POA: Diagnosis not present

## 2022-01-06 DIAGNOSIS — Z432 Encounter for attention to ileostomy: Secondary | ICD-10-CM | POA: Diagnosis not present

## 2022-01-06 DIAGNOSIS — K632 Fistula of intestine: Secondary | ICD-10-CM | POA: Diagnosis not present

## 2022-01-06 DIAGNOSIS — K631 Perforation of intestine (nontraumatic): Secondary | ICD-10-CM | POA: Diagnosis not present

## 2022-01-07 DIAGNOSIS — N184 Chronic kidney disease, stage 4 (severe): Secondary | ICD-10-CM | POA: Diagnosis not present

## 2022-01-07 DIAGNOSIS — Z432 Encounter for attention to ileostomy: Secondary | ICD-10-CM | POA: Diagnosis not present

## 2022-01-07 DIAGNOSIS — Z48815 Encounter for surgical aftercare following surgery on the digestive system: Secondary | ICD-10-CM | POA: Diagnosis not present

## 2022-01-07 DIAGNOSIS — I129 Hypertensive chronic kidney disease with stage 1 through stage 4 chronic kidney disease, or unspecified chronic kidney disease: Secondary | ICD-10-CM | POA: Diagnosis not present

## 2022-01-07 DIAGNOSIS — K631 Perforation of intestine (nontraumatic): Secondary | ICD-10-CM | POA: Diagnosis not present

## 2022-01-07 DIAGNOSIS — K632 Fistula of intestine: Secondary | ICD-10-CM | POA: Diagnosis not present

## 2022-01-11 DIAGNOSIS — K631 Perforation of intestine (nontraumatic): Secondary | ICD-10-CM | POA: Diagnosis not present

## 2022-01-11 DIAGNOSIS — Z48815 Encounter for surgical aftercare following surgery on the digestive system: Secondary | ICD-10-CM | POA: Diagnosis not present

## 2022-01-11 DIAGNOSIS — K632 Fistula of intestine: Secondary | ICD-10-CM | POA: Diagnosis not present

## 2022-01-11 DIAGNOSIS — N184 Chronic kidney disease, stage 4 (severe): Secondary | ICD-10-CM | POA: Diagnosis not present

## 2022-01-11 DIAGNOSIS — Z432 Encounter for attention to ileostomy: Secondary | ICD-10-CM | POA: Diagnosis not present

## 2022-01-11 DIAGNOSIS — I129 Hypertensive chronic kidney disease with stage 1 through stage 4 chronic kidney disease, or unspecified chronic kidney disease: Secondary | ICD-10-CM | POA: Diagnosis not present

## 2022-01-15 DIAGNOSIS — Z48815 Encounter for surgical aftercare following surgery on the digestive system: Secondary | ICD-10-CM | POA: Diagnosis not present

## 2022-01-15 DIAGNOSIS — K632 Fistula of intestine: Secondary | ICD-10-CM | POA: Diagnosis not present

## 2022-01-15 DIAGNOSIS — Z432 Encounter for attention to ileostomy: Secondary | ICD-10-CM | POA: Diagnosis not present

## 2022-01-15 DIAGNOSIS — K631 Perforation of intestine (nontraumatic): Secondary | ICD-10-CM | POA: Diagnosis not present

## 2022-01-15 DIAGNOSIS — I129 Hypertensive chronic kidney disease with stage 1 through stage 4 chronic kidney disease, or unspecified chronic kidney disease: Secondary | ICD-10-CM | POA: Diagnosis not present

## 2022-01-15 DIAGNOSIS — N184 Chronic kidney disease, stage 4 (severe): Secondary | ICD-10-CM | POA: Diagnosis not present

## 2022-01-18 DIAGNOSIS — E785 Hyperlipidemia, unspecified: Secondary | ICD-10-CM | POA: Diagnosis not present

## 2022-01-18 DIAGNOSIS — N1832 Chronic kidney disease, stage 3b: Secondary | ICD-10-CM | POA: Diagnosis not present

## 2022-01-18 DIAGNOSIS — H6121 Impacted cerumen, right ear: Secondary | ICD-10-CM | POA: Diagnosis not present

## 2022-01-18 DIAGNOSIS — Z0000 Encounter for general adult medical examination without abnormal findings: Secondary | ICD-10-CM | POA: Diagnosis not present

## 2022-01-18 DIAGNOSIS — M6281 Muscle weakness (generalized): Secondary | ICD-10-CM | POA: Diagnosis not present

## 2022-01-19 NOTE — Progress Notes (Signed)
?Cardiology Office Note:   ? ?Date:  01/20/2022  ? ?ID:  Akane Tessier, DOB [birth year], MRN [id number] ? ?PCP:  Street, Sharon Mt, MD  ?Cardiologist:  Shirlee More, MD   ? ?Referring MD: Street, Sharon Mt, *  ? ? ?ASSESSMENT:   ? ?1. Apical variant hypertrophic cardiomyopathy ([place])   ?2. Hypertensive heart disease without heart failure   ?3. Chronic atrial fibrillation (HCC)   ?4. Chronic anticoagulation   ? ?PLAN:   ? ?In order of problems listed above: ? ?Stable cardiomyopathy she appears to have a late in life nonobstructive variant does not have high risk markers and will need a follow-up echocardiogram after next visit.  Is a bit unsure how to treat these people are nonobstructive but with her atrial fibrillation we will continue her beta-blocker.  Her daughter is present I advised her to be screened for hypertrophic cardiomyopathy her grandson was screened and had a normal echo ?Markedly improved we will continue to trend her blood pressures at home at the edema does not clear off amlodipine I will place her back on a minimum dose of loop diuretic intermittently ?Rate controlled continue her current anticoagulant beta-blocker ? ? ?Next appointment: 3 months ? ? ?Medication Adjustments/Labs and Tests Ordered: ?Current medicines are reviewed at length with the patient today.  Concerns regarding medicines are outlined above.  ?No orders of the defined types were placed in this encounter. ? ?No orders of the defined types were placed in this encounter. ? ? ?Chief Complaint  ?Patient presents with  ? Follow-up  ? Cardiomyopathy  ? ? ?History of Present Illness:   ? ?Nakeysha Pasqual is a 86 y.o. female with a hx of apical variant nonobstructive hypertrophic cardiomyopathy chronic anticoagulation chronic atrial fibrillation hypertensive heart disease last seen 05/04/2021. ? ?Compliance with diet, lifestyle and medications: She is seen along with her daughter who supervises and participates in  her care she is compliant with medications ?She has had multiple prolonged hospitalizations with colonic perforation surgery multiorgan dysfunction and repeated admissions to the hospital unfortunately has returned home ?She no longer is taking a diuretic and multiple antihypertensives were stopped including amlodipine a few weeks ago clonidine remains on Micardis and carvedilol ?Because of edema her calcium channel blocker was stopped and seems to be improving asked her daughter if it persist in 2 weeks to let me know she may need to take an intermittent loop diuretic ?She has no cardiovascular symptoms of chest pain edema shortness of breath palpitation or syncope and had no cardiac complications of her hospitalization. ?Again presently is not taking a diuretic ?Past Medical History:  ?Diagnosis Date  ? Anticoagulant long-term use   ? Anxiety 10/24/2017  ? Anxiety   ? Atrial fibrillation ([place])   ? Chronic anticoagulation 10/27/2017  ? Chronic atrial fibrillation ([place]) 07/15/2015  ? Overview:  CHADS2 vasc score= 4 Echo with mild LVH, normal EF% and mild MR, LA normal Holter with rate controlled AF  ? Dyslipidemia   ? Elevated fasting glucose   ? Esophageal reflux 10/24/2017  ? GERD (gastroesophageal reflux disease)   ? Hyperlipidemia 10/24/2017  ? Hypertension 10/24/2017  ? Hypertensive heart disease 10/24/2017  ? Hypokalemia   ? Incarcerated umbilical hernia 3/66/4403  ? Persistent atrial fibrillation ([place]) 07/15/2015  ? Overview:  CHADS2 vasc score= 4 Echo with mild LVH, normal EF% and mild MR, LA normal Holter with rate controlled AF  ? ? ?Past Surgical History:  ?Procedure Laterality Date  ? BREAST EXCISIONAL  BIOPSY Right 40 yrs  ? benign  ? CATARACT EXTRACTION    ? DILATION AND CURETTAGE OF UTERUS    ? ? ?Current Medications: ?Current Meds  ?Medication Sig  ? apixaban (ELIQUIS) 5 MG TABS tablet Take 5 mg by mouth 2 (two) times daily.  ? carvedilol (COREG) 25 MG tablet Take 25 mg by mouth 2 (two) times daily with a  meal. 1 in the am and 1 at night  ? cetirizine (ZYRTEC) 10 MG tablet Take 10 mg by mouth 2 (two) times daily.  ? Cholecalciferol (VITAMIN D3) 75 MCG (3000 UT) TABS Take 1 tablet by mouth daily.  ? clorazepate (TRANXENE) 3.75 MG tablet Take 3.75 mg by mouth 2 (two) times daily as needed for anxiety.  ? escitalopram (LEXAPRO) 10 MG tablet Take 0.5 mg by mouth daily.  ? fluticasone (FLONASE) 50 MCG/ACT nasal spray Place 1 spray into both nostrils as needed for allergies.  ? omeprazole (PRILOSEC) 20 MG capsule Take 20 mg by mouth daily.  ? simvastatin (ZOCOR) 40 MG tablet Take 40 mg by mouth at bedtime.  ? telmisartan (MICARDIS) 40 MG tablet TAKE 1 TABLET BY MOUTH ONCE (1) DAILY (Patient taking differently: Take 40 mg by mouth daily.)  ?  ? ?Allergies:   Norvasc [amlodipine], Penicillins, and Sulfa antibiotics  ? ?Social History  ? ?Socioeconomic History  ? Marital status: Married  ?  Spouse name: Not on file  ? Number of children: Not on file  ? Years of education: Not on file  ? Highest education level: Not on file  ?Occupational History  ? Not on file  ?Tobacco Use  ? Smoking status: Never  ? Smokeless tobacco: Never  ?Vaping Use  ? Vaping Use: Never used  ?Substance and Sexual Activity  ? Alcohol use: No  ? Drug use: No  ? Sexual activity: Not on file  ?Other Topics Concern  ? Not on file  ?Social History Narrative  ? Not on file  ? ?Social Determinants of Health  ? ?Financial Resource Strain: Not on file  ?Food Insecurity: Not on file  ?Transportation Needs: Not on file  ?Physical Activity: Not on file  ?Stress: Not on file  ?Social Connections: Not on file  ?  ? ?Family History: ?The patient's family history includes Diabetes in her brother; Heart attack in her father; Hypertension in her brother and sister; Stroke in her mother. ?ROS:   ?Please see the history of present illness.    ?All other systems reviewed and are negative. ? ?EKGs/Labs/Other Studies Reviewed:   ? ?The following studies were reviewed  today: ? ? ?Physical Exam:   ? ?VS:  BP 128/76 (BP Location: Left Arm, Patient Position: Sitting)   Pulse 78   Ht '5\' 3"'$  (1.6 m)   Wt 191 lb 6.4 oz (86.8 kg)   SpO2 97%   BMI 33.90 kg/m?    ? ?Wt Readings from Last 3 Encounters:  ?01/20/22 191 lb 6.4 oz (86.8 kg)  ?05/04/21 221 lb (100.2 kg)  ?11/17/20 219 lb 12.8 oz (99.7 kg)  ?  ? ?GEN: She has had a marked weight loss she does not look chronically ill or debilitated well nourished, well developed in no acute distress ?HEENT: Normal ?NECK: No JVD; No carotid bruits ?LYMPHATICS: No lymphadenopathy ?CARDIAC: Irregular rate and rhythm  no murmurs, rubs, gallops ?RESPIRATORY:  Clear to auscultation without rales, wheezing or rhonchi  ?ABDOMEN: Soft, non-tender, non-distended ?MUSCULOSKELETAL:  No edema; No deformity  ?SKIN: Warm and dry ?  NEUROLOGIC:  Alert and oriented x 3 ?PSYCHIATRIC:  Normal affect  ? ? ?Signed, ?Shirlee More, MD  ?01/20/2022 12:19 PM    ?[place]  ?

## 2022-01-20 ENCOUNTER — Ambulatory Visit (INDEPENDENT_AMBULATORY_CARE_PROVIDER_SITE_OTHER): Payer: Medicare Other | Admitting: Cardiology

## 2022-01-20 ENCOUNTER — Encounter: Payer: Self-pay | Admitting: Cardiology

## 2022-01-20 VITALS — BP 128/76 | HR 78 | Ht 63.0 in | Wt 191.4 lb

## 2022-01-20 DIAGNOSIS — I119 Hypertensive heart disease without heart failure: Secondary | ICD-10-CM | POA: Diagnosis not present

## 2022-01-20 DIAGNOSIS — I482 Chronic atrial fibrillation, unspecified: Secondary | ICD-10-CM

## 2022-01-20 DIAGNOSIS — I422 Other hypertrophic cardiomyopathy: Secondary | ICD-10-CM

## 2022-01-20 DIAGNOSIS — Z7901 Long term (current) use of anticoagulants: Secondary | ICD-10-CM | POA: Diagnosis not present

## 2022-01-20 NOTE — Patient Instructions (Signed)
Medication Instructions:  Your physician recommends that you continue on your current medications as directed. Please refer to the Current Medication list given to you today.  *If you need a refill on your cardiac medications before your next appointment, please call your pharmacy*   Lab Work: None Ordered If you have labs (blood work) drawn today and your tests are completely normal, you will receive your results only by: MyChart Message (if you have MyChart) OR A paper copy in the mail If you have any lab test that is abnormal or we need to change your treatment, we will call you to review the results.   Testing/Procedures: None Ordered   Follow-Up: At [facility], you and your health needs are our priority.  As part of our continuing mission to provide you with exceptional heart care, we have created designated Provider Care Teams.  These Care Teams include your primary Cardiologist (physician) and Advanced Practice Providers (APPs -  Physician Assistants and Nurse Practitioners) who all work together to provide you with the care you need, when you need it.  We recommend signing up for the patient portal called "MyChart".  Sign up information is provided on this After Visit Summary.  MyChart is used to connect with patients for Virtual Visits (Telemedicine).  Patients are able to view lab/test results, encounter notes, upcoming appointments, etc.  Non-urgent messages can be sent to your provider as well.   To learn more about what you can do with MyChart, go to [email or web address].    Your next appointment:   6 month(s)  The format for your next appointment:   In Person  Provider:   Brian Munley, MD    Other Instructions NA  

## 2022-01-25 ENCOUNTER — Other Ambulatory Visit: Payer: Self-pay | Admitting: Cardiology

## 2022-03-31 DIAGNOSIS — G9341 Metabolic encephalopathy: Secondary | ICD-10-CM | POA: Diagnosis not present

## 2022-06-02 ENCOUNTER — Other Ambulatory Visit: Payer: Self-pay

## 2022-06-02 MED ORDER — TELMISARTAN 40 MG PO TABS: 40.0000 mg | ORAL_TABLET | Freq: Every day | ORAL | 2 refills | Status: DC

## 2022-08-02 DIAGNOSIS — Z23 Encounter for immunization: Secondary | ICD-10-CM | POA: Diagnosis not present

## 2022-09-01 ENCOUNTER — Telehealth: Payer: Self-pay

## 2022-09-01 NOTE — Patient Outreach (Signed)
  Care Coordination   Initial Visit Note   09/01/2022 Name: Stephanie Wiggins MRN: [id number] DOB: [birth year]  Stephanie Wiggins is a 86 y.o. year old female who sees Street, Sharon Mt, MD for primary care. I spoke with  Stephanie Wiggins by phone today.  What matters to the patients health and wellness today?  Placed call to patient to review and offer [facility] care coordination program. Patient reports that she is doing well and denies any needs today.    SDOH assessments and interventions completed:  No     Care Coordination Interventions:  No, not indicated   Follow up plan: No further intervention required.   Encounter Outcome:  Pt. Refused   Stephanie Rand, RN, BSN, CEN [facility] [employer] [phone]

## 2023-01-03 ENCOUNTER — Telehealth: Payer: Self-pay | Admitting: Cardiology

## 2023-01-03 MED ORDER — CARVEDILOL 25 MG PO TABS: 25.0000 mg | ORAL_TABLET | Freq: Two times a day (BID) | ORAL | 0 refills | Status: DC

## 2023-01-03 NOTE — Telephone Encounter (Signed)
Refill of Carvedilol 25 mg sent to [facility].

## 2023-01-03 NOTE — Telephone Encounter (Signed)
*  STAT* If patient is at the pharmacy, call can be transferred to refill team.   1. Which medications need to be refilled? (please list name of each medication and dose if known) carvedilol (COREG) 25 MG tablet   2. Which pharmacy/location (including street and city if local pharmacy) is medication to be sent to? [facility] - [place], [place] - [address]   3. Do they need a 30 day or 90 day supply? 90 day

## 2023-01-06 ENCOUNTER — Ambulatory Visit: Payer: HMO | Attending: Cardiology | Admitting: Cardiology

## 2023-01-06 ENCOUNTER — Encounter: Payer: Self-pay | Admitting: Cardiology

## 2023-01-06 VITALS — BP 148/80 | HR 82 | Ht 63.0 in | Wt 210.0 lb

## 2023-01-06 DIAGNOSIS — I482 Chronic atrial fibrillation, unspecified: Secondary | ICD-10-CM | POA: Diagnosis not present

## 2023-01-06 DIAGNOSIS — I119 Hypertensive heart disease without heart failure: Secondary | ICD-10-CM | POA: Diagnosis not present

## 2023-01-06 DIAGNOSIS — I422 Other hypertrophic cardiomyopathy: Secondary | ICD-10-CM | POA: Diagnosis not present

## 2023-01-06 DIAGNOSIS — Z7901 Long term (current) use of anticoagulants: Secondary | ICD-10-CM | POA: Diagnosis not present

## 2023-01-06 NOTE — Patient Instructions (Signed)
Medication Instructions:  Your physician recommends that you continue on your current medications as directed. Please refer to the Current Medication list given to you today.  *If you need a refill on your cardiac medications before your next appointment, please call your pharmacy*   Lab Work: None If you have labs (blood work) drawn today and your tests are completely normal, you will receive your results only by: MyChart Message (if you have MyChart) OR A paper copy in the mail If you have any lab test that is abnormal or we need to change your treatment, we will call you to review the results.   Testing/Procedures: None   Follow-Up: At [facility], you and your health needs are our priority.  As part of our continuing mission to provide you with exceptional heart care, we have created designated Provider Care Teams.  These Care Teams include your primary Cardiologist (physician) and Advanced Practice Providers (APPs -  Physician Assistants and Nurse Practitioners) who all work together to provide you with the care you need, when you need it.  We recommend signing up for the patient portal called "MyChart".  Sign up information is provided on this After Visit Summary.  MyChart is used to connect with patients for Virtual Visits (Telemedicine).  Patients are able to view lab/test results, encounter notes, upcoming appointments, etc.  Non-urgent messages can be sent to your provider as well.   To learn more about what you can do with MyChart, go to [email or web address].    Your next appointment:   1 year(s)  Provider:   Brian Munley, MD    Other Instructions None  

## 2023-01-06 NOTE — Progress Notes (Signed)
Cardiology Office Note:    Date:  01/06/2023   ID:  Stephanie Wiggins, DOB 02/05/1936, MRN [id number]  PCP:  Street, Stephanie Wiggins  Cardiologist:  Norman HerrlichBrian Daisuke Bailey, Wiggins    Referring Wiggins: [address], Stephanie Wiggins, *    ASSESSMENT:    1. Apical variant hypertrophic cardiomyopathy   2. Hypertensive heart disease without heart failure   3. Chronic atrial fibrillation   4. Chronic anticoagulation    PLAN:    In order of problems listed above:  She continues to do well with her nonobstructive hypertrophic cardiomyopathy relatively asymptomatic and has had no concerning symptoms of chest pain or syncope no findings of heart failure blood pressure is controlled atrial fibrillation is controlled and she continues with her anticoagulant.  I did strongly encourage annual lab work done at PCP office I will plan to see her back in the office in 1 year or sooner if needed We discussed echocardiogram she prefers not to repeat at this time   Next appointment: 1 year   Medication Adjustments/Labs and Tests Ordered: Current medicines are reviewed at length with the patient today.  Concerns regarding medicines are outlined above.  No orders of the defined types were placed in this encounter.  No orders of the defined types were placed in this encounter.      History of Present Illness:    Stephanie Wiggins is a 87 y.o. female with a hx of apical variant nonobstructive hypertrophic cardiomyopathy chronic atrial fibrillation on anticoagulation and hypertensive heart disease last seen 01/20/2022. Compliance with diet, lifestyle and medications: Yes  Overall she is doing well she is pleased with the quality of her life lives independently and enjoys her dog Sleep and self fatigue or shortness of breath with activities like heavy housework but otherwise is not having edema shortness of breath chest pain palpitation or syncope She tolerates her anticoagulant without bleeding. She has an  upcoming appointment with her PCP in the next few weeks Full labs done Grandson was screened and does not have hypertrophic cardiomyopathy Past Medical History:  Diagnosis Date   Anticoagulant long-term use    Anxiety 10/24/2017   Anxiety    Atrial fibrillation    Chronic anticoagulation 10/27/2017   Chronic atrial fibrillation 07/15/2015   Overview:  CHADS2 vasc score= 4 Echo with mild LVH, normal EF% and mild MR, LA normal Holter with rate controlled AF   Dyslipidemia    Elevated fasting glucose    Esophageal reflux 10/24/2017   GERD (gastroesophageal reflux disease)    Hyperlipidemia 10/24/2017   Hypertension 10/24/2017   Hypertensive heart disease 10/24/2017   Hypokalemia    Incarcerated umbilical hernia 12/12/2019   Persistent atrial fibrillation 07/15/2015   Overview:  CHADS2 vasc score= 4 Echo with mild LVH, normal EF% and mild MR, LA normal Holter with rate controlled AF    Past Surgical History:  Procedure Laterality Date   BREAST EXCISIONAL BIOPSY Right 40 yrs   benign   CATARACT EXTRACTION     DILATION AND CURETTAGE OF UTERUS      Current Medications: Current Meds  Medication Sig   apixaban (ELIQUIS) 5 MG TABS tablet Take 5 mg by mouth 2 (two) times daily.   atorvastatin (LIPITOR) 40 MG tablet Take 40 mg by mouth daily.   carvedilol (COREG) 25 MG tablet Take 1 tablet (25 mg total) by mouth 2 (two) times daily with a meal.   cetirizine (ZYRTEC) 10 MG tablet Take 10 mg by mouth 2 (two)  times daily.   Cholecalciferol (VITAMIN D3) 75 MCG (3000 UT) TABS Take 1 tablet by mouth daily.   clorazepate (TRANXENE) 3.75 MG tablet Take 3.75 mg by mouth 2 (two) times daily as needed for anxiety.   escitalopram (LEXAPRO) 10 MG tablet Take 0.5 mg by mouth daily.   omeprazole (PRILOSEC) 20 MG capsule Take 20 mg by mouth daily.   Simethicone (GAS-X PO) Take by mouth daily as needed (Heartburn).     Allergies:   Norvasc [amlodipine], Penicillins, and Sulfa antibiotics   Social  History   Socioeconomic History   Marital status: Married    Spouse name: Not on file   Number of children: Not on file   Years of education: Not on file   Highest education level: Not on file  Occupational History   Not on file  Tobacco Use   Smoking status: Never   Smokeless tobacco: Never  Vaping Use   Vaping Use: Never used  Substance and Sexual Activity   Alcohol use: No   Drug use: No   Sexual activity: Not on file  Other Topics Concern   Not on file  Social History Narrative   Not on file   Social Determinants of Health   Financial Resource Strain: Not on file  Food Insecurity: Not on file  Transportation Needs: Not on file  Physical Activity: Not on file  Stress: Not on file  Social Connections: Not on file     Family History: The patient's family history includes Diabetes in her brother; Heart attack in her father; Hypertension in her brother and sister; Stroke in her mother. ROS:   Please see the history of present illness.    All other systems reviewed and are negative.  EKGs/Labs/Other Studies Reviewed:    The following studies were reviewed today:  Cardiac Studies & Procedures       ECHOCARDIOGRAM  ECHOCARDIOGRAM LIMITED 01/14/2020  Narrative ECHOCARDIOGRAM LIMITED REPORT    Patient Name:   Stephanie Wiggins Date of Exam: 01/14/2020 Medical Rec #:  [id number]               Height:       64.0 in Accession #:    [id number]              Weight:       208.8 lb Date of Birth:  1936-01-09                BSA:          1.993 Wiggins Patient Age:    83 years                BP:           202/137 mmHg Patient Gender: F                       HR:           61 bpm. Exam Location:  [facility]  Procedure: Limited Echo and Intracardiac Opacification Agent  Indications:    Apical Variant Hypertrophic Cardiomyopathy i42.2  History:        Patient has prior history of Echocardiogram examinations, most recent 01/09/2020. Arrythmias:Atrial Fibrillation;  Risk Factors:Hypertension and Dyslipidemia.  Sonographer:    Irving Burton Senior RDCS Referring Phys: [phone] Baldo Daub   FINDINGS Left Ventricle: Definity contrast agent was given IV to delineate the left ventricular endocardial borders.Spade shaped LV on contrast enhanced imaging suggests LV Apical Hyertrophic Cardiomyopathy. This report  is an addendum on the report on the full echo from last week.  Additional Commentssss   Electronically signed by Belva Crome Wiggins Signature Date/Time: 01/14/2020/12:17:45 PM    Final    MONITORS  LONG TERM MONITOR (3-14 DAYS) 02/09/2020  Narrative A ZIO monitor was used for 3 days beginning 01/15/2020 to evaluate for arrhythmia in the setting of atrial fibrillation and apical hypertrophic cardiomyopathy.  The cardiac rhythm throughout was atrial fibrillation with average minimum of maximum heart rates of 64, 43 and 101 bpm.  Daytime heart rates was 50 to 110 bpm 99% of the time and nighttime heart rate 100% of the time.  There were no pauses of 3 seconds or greater or no persistent bradycardia with atrial fibrillation.  There are no triggered or diary events.  Ventricular ectopy was rare with isolated PVCs no couplets triplets or episodes of ventricular tachycardia.   Conclusion, atrial fibrillation with a  controlled ventricular rate.  There is no evidence of complex or high frequency ventricular ectopy in the setting of apical hypertrophic cardiomyopathy.           EKG:  EKG ordered today and personally reviewed.  The ekg ordered today demonstrates atrial fibrillation controlled rate nonspecific ST-T waves   Physical Exam:    VS:  BP (!) 162/100 (BP Location: Right Arm, Patient Position: Sitting)   Pulse 82   Ht 5\' 3"  (1.6 Wiggins)   Wt 210 lb (95.3 kg)   SpO2 98%   BMI 37.20 kg/Wiggins     Wt Readings from Last 3 Encounters:  01/06/23 210 lb (95.3 kg)  01/20/22 191 lb 6.4 oz (86.8 kg)  05/04/21 221 lb (100.2 kg)     GEN:  Well  nourished, well developed in no acute distress HEENT: Normal NECK: No JVD; No carotid bruits LYMPHATICS: No lymphadenopathy CARDIAC: Irregular rhythm variable first heart sound RRR, no murmurs, rubs, gallops RESPIRATORY:  Clear to auscultation without rales, wheezing or rhonchi  ABDOMEN: Soft, non-tender, non-distended MUSCULOSKELETAL:  No edema; No deformity  SKIN: Warm and dry NEUROLOGIC:  Alert and oriented x 3 PSYCHIATRIC:  Normal affect    Signed, Norman Herrlich, Wiggins  01/06/2023 3:53 PM    [facility]

## 2023-01-21 DIAGNOSIS — I4891 Unspecified atrial fibrillation: Secondary | ICD-10-CM | POA: Diagnosis not present

## 2023-01-21 DIAGNOSIS — D6869 Other thrombophilia: Secondary | ICD-10-CM | POA: Diagnosis not present

## 2023-01-21 DIAGNOSIS — E785 Hyperlipidemia, unspecified: Secondary | ICD-10-CM | POA: Diagnosis not present

## 2023-01-21 DIAGNOSIS — E559 Vitamin D deficiency, unspecified: Secondary | ICD-10-CM | POA: Diagnosis not present

## 2023-01-21 DIAGNOSIS — Z79899 Other long term (current) drug therapy: Secondary | ICD-10-CM | POA: Diagnosis not present

## 2023-01-21 DIAGNOSIS — R7301 Impaired fasting glucose: Secondary | ICD-10-CM | POA: Diagnosis not present

## 2023-01-21 DIAGNOSIS — I2510 Atherosclerotic heart disease of native coronary artery without angina pectoris: Secondary | ICD-10-CM | POA: Diagnosis not present

## 2023-01-21 DIAGNOSIS — I700 Atherosclerosis of aorta: Secondary | ICD-10-CM | POA: Diagnosis not present

## 2023-01-21 DIAGNOSIS — I2584 Coronary atherosclerosis due to calcified coronary lesion: Secondary | ICD-10-CM | POA: Diagnosis not present

## 2023-01-21 DIAGNOSIS — E876 Hypokalemia: Secondary | ICD-10-CM | POA: Diagnosis not present

## 2023-01-21 DIAGNOSIS — Z0000 Encounter for general adult medical examination without abnormal findings: Secondary | ICD-10-CM | POA: Diagnosis not present

## 2023-01-21 DIAGNOSIS — I10 Essential (primary) hypertension: Secondary | ICD-10-CM | POA: Diagnosis not present

## 2023-04-12 DIAGNOSIS — H18413 Arcus senilis, bilateral: Secondary | ICD-10-CM | POA: Diagnosis not present

## 2023-04-12 DIAGNOSIS — H2511 Age-related nuclear cataract, right eye: Secondary | ICD-10-CM | POA: Diagnosis not present

## 2023-04-12 DIAGNOSIS — H35372 Puckering of macula, left eye: Secondary | ICD-10-CM | POA: Diagnosis not present

## 2023-04-12 DIAGNOSIS — Z961 Presence of intraocular lens: Secondary | ICD-10-CM | POA: Diagnosis not present

## 2023-04-12 DIAGNOSIS — H25041 Posterior subcapsular polar age-related cataract, right eye: Secondary | ICD-10-CM | POA: Diagnosis not present

## 2023-04-14 ENCOUNTER — Other Ambulatory Visit: Payer: Self-pay | Admitting: Cardiology

## 2023-06-20 DIAGNOSIS — H25811 Combined forms of age-related cataract, right eye: Secondary | ICD-10-CM | POA: Diagnosis not present

## 2023-06-20 DIAGNOSIS — H2511 Age-related nuclear cataract, right eye: Secondary | ICD-10-CM | POA: Diagnosis not present

## 2023-07-01 DIAGNOSIS — Z934 Other artificial openings of gastrointestinal tract status: Secondary | ICD-10-CM | POA: Diagnosis not present

## 2023-07-01 DIAGNOSIS — Z939 Artificial opening status, unspecified: Secondary | ICD-10-CM | POA: Diagnosis not present

## 2023-07-01 DIAGNOSIS — K631 Perforation of intestine (nontraumatic): Secondary | ICD-10-CM | POA: Diagnosis not present

## 2023-07-01 DIAGNOSIS — Z932 Ileostomy status: Secondary | ICD-10-CM | POA: Diagnosis not present

## 2023-09-23 DIAGNOSIS — Z932 Ileostomy status: Secondary | ICD-10-CM | POA: Diagnosis not present

## 2023-09-23 DIAGNOSIS — Z934 Other artificial openings of gastrointestinal tract status: Secondary | ICD-10-CM | POA: Diagnosis not present

## 2023-09-23 DIAGNOSIS — Z939 Artificial opening status, unspecified: Secondary | ICD-10-CM | POA: Diagnosis not present

## 2023-09-23 DIAGNOSIS — K631 Perforation of intestine (nontraumatic): Secondary | ICD-10-CM | POA: Diagnosis not present

## 2024-03-20 ENCOUNTER — Encounter: Payer: Self-pay | Admitting: Cardiology

## 2024-04-11 DIAGNOSIS — Z0000 Encounter for general adult medical examination without abnormal findings: Secondary | ICD-10-CM | POA: Diagnosis not present

## 2024-04-11 DIAGNOSIS — Z934 Other artificial openings of gastrointestinal tract status: Secondary | ICD-10-CM | POA: Diagnosis not present

## 2024-04-11 DIAGNOSIS — I4891 Unspecified atrial fibrillation: Secondary | ICD-10-CM | POA: Diagnosis not present

## 2024-04-11 DIAGNOSIS — Z131 Encounter for screening for diabetes mellitus: Secondary | ICD-10-CM | POA: Diagnosis not present

## 2024-04-11 DIAGNOSIS — I2510 Atherosclerotic heart disease of native coronary artery without angina pectoris: Secondary | ICD-10-CM | POA: Diagnosis not present

## 2024-04-11 DIAGNOSIS — E559 Vitamin D deficiency, unspecified: Secondary | ICD-10-CM | POA: Diagnosis not present

## 2024-04-11 DIAGNOSIS — I10 Essential (primary) hypertension: Secondary | ICD-10-CM | POA: Diagnosis not present

## 2024-04-11 DIAGNOSIS — Z79899 Other long term (current) drug therapy: Secondary | ICD-10-CM | POA: Diagnosis not present

## 2024-04-11 DIAGNOSIS — Z932 Ileostomy status: Secondary | ICD-10-CM | POA: Diagnosis not present

## 2024-04-11 DIAGNOSIS — D6869 Other thrombophilia: Secondary | ICD-10-CM | POA: Diagnosis not present

## 2024-04-11 DIAGNOSIS — K2960 Other gastritis without bleeding: Secondary | ICD-10-CM | POA: Diagnosis not present

## 2024-04-11 DIAGNOSIS — E785 Hyperlipidemia, unspecified: Secondary | ICD-10-CM | POA: Diagnosis not present

## 2024-04-11 LAB — LAB REPORT - SCANNED
A1c: 6.3
EGFR: 21

## 2024-04-23 ENCOUNTER — Ambulatory Visit: Admitting: Cardiology

## 2024-05-07 NOTE — Progress Notes (Signed)
 Cardiology Office Note   Date:  05/08/2024  ID:  [name], DOB August 21, 1936, MRN [id number] PCP: Street, Lonni HERO, MD  [facility] Providers Cardiologist:  Redell Leiter, MD Cardiology APP:  Carlin Delon BROCKS, NP     History of Present Illness Stephanie Wiggins is a 88 y.o. female with a past medical history of apical variant hypertrophic cardiomyopathy, atrial fibrillation, moderate MR, hypertension, GERD, dyslipidemia.  02/09/2020 monitor heart rhythm was atrial fibrillation, typically occurred 99% of the day and 100% at night, VE's were rare 01/14/2020 echo  shape left ventricular noncontrast suggesting LV apical hypertrophic cardiomyopathy, LA moderately dilated, moderate MR  Ms. Brazill is a longstanding patient of Dr. Leiter, initially established in 2016 for the management of her atrial fibrillation and hypertension.  The decision had been made at some point to pursue rate control regarding her atrial fibrillation.  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  Her blood pressure has been very difficult to control, not amenable to checking it at home.  Most recently she was evaluated by Dr. Leiter on 01/06/2023, she declined repeat echocardiogram, no further changes were made to her medications or plan of care and she was advised to follow-up in a year.  She presents today for follow up of her atrial fibrillation. She is doing well, lives independently. Has been doing well since she was last evaluated in our office. Her BP is elevated today, but she attributes this to white coat hypertension and reported it was well controlled at her last MD visit, 120/70. She denies chest pain, palpitations, dyspnea, pnd, orthopnea, n, v, dizziness, syncope, edema, weight gain, or early satiety.   ROS: Review of Systems  Cardiovascular:  Positive for leg swelling (at times).  Neurological:        Vertigo at times  All other  systems reviewed and are negative.    Studies Reviewed EKG Interpretation Date/Time:  Tuesday May 08 2024 13:35:41 EDT Ventricular Rate:  79 PR Interval:    QRS Duration:  78 QT Interval:  412 QTC Calculation: 472 R Axis:   -13  Text Interpretation: Atrial fibrillation Moderate voltage criteria for LVH, may be normal variant T wave abnormality, consider inferolateral ischemia Prolonged QT Abnormal ECG No previous ECGs available Confirmed by Carlin Delon [phone]) on 05/08/2024 1:40:08 PM    Cardiac Studies & Procedures   ______________________________________________________________________________________________     ECHOCARDIOGRAM  ECHOCARDIOGRAM LIMITED 01/14/2020  Narrative ECHOCARDIOGRAM LIMITED REPORT    Patient Name:   Stephanie Wiggins Date of Exam: 01/14/2020 Medical Rec #:  [id number]               Height:       64.0 in Accession #:    [id number]              Weight:       208.8 lb Date of Birth:  02/01/1936                BSA:          1.993 m Patient Age:    83 years                BP:           202/137 mmHg Patient Gender: F                       HR:  61 bpm. Exam Location:  [facility]  Procedure: Limited Echo and Intracardiac Opacification Agent  Indications:    Apical Variant Hypertrophic Cardiomyopathy i42.2  History:        Patient has prior history of Echocardiogram examinations, most recent 01/09/2020. Arrythmias:Atrial Fibrillation; Risk Factors:Hypertension and Dyslipidemia.  Sonographer:    Damien Senior RDCS Referring Phys: [phone] REDELL JINNY LEITER   FINDINGS Left Ventricle: Definity contrast agent was given IV to delineate the left ventricular endocardial borders.Spade shaped LV on contrast enhanced imaging suggests LV Apical Hyertrophic Cardiomyopathy. This report is an addendum on the report on the full echo from last week.  Additional Commentssss   Electronically signed by Knox Holdman Crape MD Signature Date/Time:  01/14/2020/12:17:45 PM    Final    MONITORS  LONG TERM MONITOR (3-14 DAYS) 01/15/2020  Narrative A ZIO monitor was used for 3 days beginning 01/15/2020 to evaluate for arrhythmia in the setting of atrial fibrillation and apical hypertrophic cardiomyopathy.  The cardiac rhythm throughout was atrial fibrillation with average minimum of maximum heart rates of 64, 43 and 101 bpm.  Daytime heart rates was 50 to 110 bpm 99% of the time and nighttime heart rate 100% of the time.  There were no pauses of 3 seconds or greater or no persistent bradycardia with atrial fibrillation.  There are no triggered or diary events.  Ventricular ectopy was rare with isolated PVCs no couplets triplets or episodes of ventricular tachycardia.   Conclusion, atrial fibrillation with a  controlled ventricular rate.  There is no evidence of complex or high frequency ventricular ectopy in the setting of apical hypertrophic cardiomyopathy.       ______________________________________________________________________________________________      Risk Assessment/Calculations  CHA2DS2-VASc Score = 4   This indicates a 4.8% annual risk of stroke. The patient's score is based upon: CHF History: 0 HTN History: 1 Diabetes History: 0 Stroke History: 0 Vascular Disease History: 0 Age Score: 2 Gender Score: 1    HYPERTENSION CONTROL Vitals:   05/08/24 1338 05/08/24 1406  BP: (!) 148/100 (!) 148/96    The patient's blood pressure is elevated above target today.  In order to address the patient's elevated BP: The blood pressure is usually elevated in clinic.  Blood pressures monitored at home have been optimal.          Physical Exam VS:  BP (!) 148/96   Pulse 79   Ht 5' 3 (1.6 m)   Wt 221 lb 6.4 oz (100.4 kg)   SpO2 94%   BMI 39.22 kg/m        Wt Readings from Last 3 Encounters:  05/08/24 221 lb 6.4 oz (100.4 kg)  01/06/23 210 lb (95.3 kg)  01/20/22 191 lb 6.4 oz (86.8 kg)    GEN: Well  nourished, well developed in no acute distress NECK: No JVD; No carotid bruits CARDIAC: irregularly irregular, no murmurs, rubs, gallops RESPIRATORY:  Clear to auscultation without rales, wheezing or rhonchi  ABDOMEN: Soft, non-tender, non-distended EXTREMITIES:  No edema; No deformity   ASSESSMENT AND PLAN Permanent atrial fibrillation/hypercoagulable state-CHA2DS2-VASc score is 5, her rate is controlled, she is on reduced dose Eliquis 2.5 mg twice daily-presumably for age and creatinine although I do not have a recent CMET on file, lab work was completed by her PCP last month and we will request labs from their office.  Continue Coreg  12.5 mg twice daily.  Apical variant hypertrophic cardiomyopathy - does not want to repeat echo at this time, denies CP, SOB, palpitations  or dizziness.  Mitral regurgitation-moderate per most recent echo in 2021, she has asymptomatic, we did discuss repeating echocardiogram but she would like to wait for now and discuss further with Dr. Monetta.  Dyslipidemia-formally monitored by her PCP, currently on Lipitor 40 mg daily.  Hypertension-blood pressure is elevated in the office today however she states it is typically well-controlled.  Continue Coreg  12.5 mg twice daily.       Dispo: Request labs from PCP.  Follow-up in 6 months, discussed repeating echocardiogram at that time with Dr. Monetta.  Signed, Delon JAYSON Hoover, NP

## 2024-05-08 ENCOUNTER — Encounter: Payer: Self-pay | Admitting: Cardiology

## 2024-05-08 ENCOUNTER — Ambulatory Visit: Attending: Cardiology | Admitting: Cardiology

## 2024-05-08 VITALS — BP 148/96 | HR 79 | Ht 63.0 in | Wt 221.4 lb

## 2024-05-08 DIAGNOSIS — Z939 Artificial opening status, unspecified: Secondary | ICD-10-CM | POA: Diagnosis not present

## 2024-05-08 DIAGNOSIS — K631 Perforation of intestine (nontraumatic): Secondary | ICD-10-CM | POA: Diagnosis not present

## 2024-05-08 DIAGNOSIS — E785 Hyperlipidemia, unspecified: Secondary | ICD-10-CM | POA: Diagnosis not present

## 2024-05-08 DIAGNOSIS — D6859 Other primary thrombophilia: Secondary | ICD-10-CM

## 2024-05-08 DIAGNOSIS — I340 Nonrheumatic mitral (valve) insufficiency: Secondary | ICD-10-CM

## 2024-05-08 DIAGNOSIS — I10 Essential (primary) hypertension: Secondary | ICD-10-CM

## 2024-05-08 DIAGNOSIS — I422 Other hypertrophic cardiomyopathy: Secondary | ICD-10-CM | POA: Diagnosis not present

## 2024-05-08 DIAGNOSIS — I482 Chronic atrial fibrillation, unspecified: Secondary | ICD-10-CM | POA: Diagnosis not present

## 2024-05-08 DIAGNOSIS — Z932 Ileostomy status: Secondary | ICD-10-CM | POA: Diagnosis not present

## 2024-05-08 DIAGNOSIS — Z934 Other artificial openings of gastrointestinal tract status: Secondary | ICD-10-CM | POA: Diagnosis not present

## 2024-05-08 MED ORDER — CARVEDILOL 25 MG PO TABS: 12.5000 mg | ORAL_TABLET | Freq: Two times a day (BID) | ORAL | 2 refills | Status: AC

## 2024-05-08 NOTE — Patient Instructions (Signed)
 Medication Instructions:   No changes   *If you need a refill on your cardiac medications before your next appointment, please call your pharmacy*  Lab Work:  None today   If you have labs (blood work) drawn today and your tests are completely normal, you will receive your results only by: MyChart Message (if you have MyChart) OR A paper copy in the mail If you have any lab test that is abnormal or we need to change your treatment, we will call you to review the results.  Testing/Procedures:  None  Follow-Up: At [facility], you and your health needs are our priority.  As part of our continuing mission to provide you with exceptional heart care, our providers are all part of one team.  This team includes your primary Cardiologist (physician) and Advanced Practice Providers or APPs (Physician Assistants and Nurse Practitioners) who all work together to provide you with the care you need, when you need it.  Your next appointment:   6 month(s)  Provider:   Redell Leiter, MD    We recommend signing up for the patient portal called MyChart.  Sign up information is provided on this After Visit Summary.  MyChart is used to connect with patients for Virtual Visits (Telemedicine).  Patients are able to view lab/test results, encounter notes, upcoming appointments, etc.  Non-urgent messages can be sent to your provider as well.   To learn more about what you can do with MyChart, go to [email or web address].   Other Instructions

## 2024-11-29 ENCOUNTER — Ambulatory Visit: Admitting: Cardiology
